# Patient Record
Sex: Female | Born: 1955 | Race: White | Hispanic: No | Marital: Married | Smoking: Former smoker
Health system: Southern US, Community
[De-identification: ages and names within clinical notes are randomized; demographics above are authoritative.]

## PROBLEM LIST (undated history)

## (undated) DIAGNOSIS — M797 Fibromyalgia: Secondary | ICD-10-CM

## (undated) DIAGNOSIS — M533 Sacrococcygeal disorders, not elsewhere classified: Secondary | ICD-10-CM

## (undated) DIAGNOSIS — M25551 Pain in right hip: Secondary | ICD-10-CM

## (undated) DIAGNOSIS — F419 Anxiety disorder, unspecified: Secondary | ICD-10-CM

## (undated) DIAGNOSIS — G8929 Other chronic pain: Secondary | ICD-10-CM

## (undated) DIAGNOSIS — M545 Low back pain: Secondary | ICD-10-CM

## (undated) DIAGNOSIS — G4730 Sleep apnea, unspecified: Secondary | ICD-10-CM

## (undated) DIAGNOSIS — N3281 Overactive bladder: Secondary | ICD-10-CM

## (undated) DIAGNOSIS — G4733 Obstructive sleep apnea (adult) (pediatric): Secondary | ICD-10-CM

## (undated) DIAGNOSIS — M79606 Pain in leg, unspecified: Secondary | ICD-10-CM

## (undated) DIAGNOSIS — E785 Hyperlipidemia, unspecified: Secondary | ICD-10-CM

## (undated) DIAGNOSIS — G43909 Migraine, unspecified, not intractable, without status migrainosus: Secondary | ICD-10-CM

## (undated) HISTORY — DX: Fibromyalgia: M79.7

## (undated) HISTORY — DX: Sleep apnea, unspecified: G47.30

## (undated) HISTORY — PX: BREAST EXCISIONAL BIOPSY: SUR124

## (undated) HISTORY — DX: Overactive bladder: N32.81

## (undated) HISTORY — DX: Anxiety disorder, unspecified: F41.9

## (undated) HISTORY — PX: CARPAL TUNNEL RELEASE: SHX101

## (undated) HISTORY — DX: Low back pain: M54.5

## (undated) HISTORY — DX: Other chronic pain: G89.29

## (undated) HISTORY — DX: Hyperlipidemia, unspecified: E78.5

## (undated) HISTORY — PX: TUBAL LIGATION: SHX77

## (undated) HISTORY — DX: Sacrococcygeal disorders, not elsewhere classified: M53.3

## (undated) HISTORY — DX: Pain in right hip: M25.551

## (undated) HISTORY — DX: Pain in leg, unspecified: M79.606

## (undated) HISTORY — DX: Migraine, unspecified, not intractable, without status migrainosus: G43.909

## (undated) HISTORY — DX: Obstructive sleep apnea (adult) (pediatric): G47.33

---

## 2015-03-28 DIAGNOSIS — N951 Menopausal and female climacteric states: Secondary | ICD-10-CM | POA: Insufficient documentation

## 2015-03-28 DIAGNOSIS — M797 Fibromyalgia: Secondary | ICD-10-CM | POA: Insufficient documentation

## 2015-03-28 DIAGNOSIS — G4733 Obstructive sleep apnea (adult) (pediatric): Secondary | ICD-10-CM | POA: Insufficient documentation

## 2015-03-28 DIAGNOSIS — E7800 Pure hypercholesterolemia, unspecified: Secondary | ICD-10-CM | POA: Insufficient documentation

## 2015-03-28 DIAGNOSIS — N3281 Overactive bladder: Secondary | ICD-10-CM | POA: Insufficient documentation

## 2015-03-28 DIAGNOSIS — F411 Generalized anxiety disorder: Secondary | ICD-10-CM | POA: Insufficient documentation

## 2015-09-05 DIAGNOSIS — R42 Dizziness and giddiness: Secondary | ICD-10-CM | POA: Insufficient documentation

## 2015-09-05 DIAGNOSIS — G43109 Migraine with aura, not intractable, without status migrainosus: Secondary | ICD-10-CM | POA: Insufficient documentation

## 2016-01-07 ENCOUNTER — Ambulatory Visit: Payer: BLUE CROSS/BLUE SHIELD | Attending: Pain Medicine | Admitting: Pain Medicine

## 2016-01-07 ENCOUNTER — Encounter: Payer: Self-pay | Admitting: Pain Medicine

## 2016-01-07 VITALS — BP 141/69 | HR 84 | Temp 98.2°F | Resp 16 | Ht 62.0 in | Wt 168.0 lb

## 2016-01-07 DIAGNOSIS — R42 Dizziness and giddiness: Secondary | ICD-10-CM | POA: Insufficient documentation

## 2016-01-07 DIAGNOSIS — M25551 Pain in right hip: Secondary | ICD-10-CM | POA: Insufficient documentation

## 2016-01-07 DIAGNOSIS — G8929 Other chronic pain: Secondary | ICD-10-CM

## 2016-01-07 DIAGNOSIS — M79605 Pain in left leg: Secondary | ICD-10-CM | POA: Diagnosis not present

## 2016-01-07 DIAGNOSIS — M79604 Pain in right leg: Secondary | ICD-10-CM | POA: Diagnosis not present

## 2016-01-07 DIAGNOSIS — F17290 Nicotine dependence, other tobacco product, uncomplicated: Secondary | ICD-10-CM

## 2016-01-07 DIAGNOSIS — M545 Low back pain, unspecified: Secondary | ICD-10-CM | POA: Insufficient documentation

## 2016-01-07 DIAGNOSIS — Z5181 Encounter for therapeutic drug level monitoring: Secondary | ICD-10-CM

## 2016-01-07 DIAGNOSIS — Z79899 Other long term (current) drug therapy: Secondary | ICD-10-CM

## 2016-01-07 DIAGNOSIS — F329 Major depressive disorder, single episode, unspecified: Secondary | ICD-10-CM | POA: Insufficient documentation

## 2016-01-07 DIAGNOSIS — Z0189 Encounter for other specified special examinations: Secondary | ICD-10-CM

## 2016-01-07 DIAGNOSIS — F17200 Nicotine dependence, unspecified, uncomplicated: Secondary | ICD-10-CM | POA: Diagnosis not present

## 2016-01-07 DIAGNOSIS — M533 Sacrococcygeal disorders, not elsewhere classified: Secondary | ICD-10-CM | POA: Insufficient documentation

## 2016-01-07 DIAGNOSIS — G4733 Obstructive sleep apnea (adult) (pediatric): Secondary | ICD-10-CM | POA: Diagnosis not present

## 2016-01-07 DIAGNOSIS — F411 Generalized anxiety disorder: Secondary | ICD-10-CM | POA: Diagnosis not present

## 2016-01-07 DIAGNOSIS — M47816 Spondylosis without myelopathy or radiculopathy, lumbar region: Secondary | ICD-10-CM

## 2016-01-07 DIAGNOSIS — Z79891 Long term (current) use of opiate analgesic: Secondary | ICD-10-CM

## 2016-01-07 DIAGNOSIS — E7800 Pure hypercholesterolemia, unspecified: Secondary | ICD-10-CM | POA: Insufficient documentation

## 2016-01-07 DIAGNOSIS — G43109 Migraine with aura, not intractable, without status migrainosus: Secondary | ICD-10-CM | POA: Insufficient documentation

## 2016-01-07 DIAGNOSIS — M79606 Pain in leg, unspecified: Secondary | ICD-10-CM

## 2016-01-07 DIAGNOSIS — F17201 Nicotine dependence, unspecified, in remission: Secondary | ICD-10-CM | POA: Insufficient documentation

## 2016-01-07 DIAGNOSIS — F1190 Opioid use, unspecified, uncomplicated: Secondary | ICD-10-CM

## 2016-01-07 HISTORY — DX: Other chronic pain: G89.29

## 2016-01-07 HISTORY — DX: Pain in right hip: M25.551

## 2016-01-07 HISTORY — DX: Sacrococcygeal disorders, not elsewhere classified: M53.3

## 2016-01-07 HISTORY — DX: Other chronic pain: M54.50

## 2016-01-07 HISTORY — DX: Pain in leg, unspecified: M79.606

## 2016-01-07 NOTE — Progress Notes (Signed)
Safety precautions to be maintained throughout the outpatient stay will include: orient to surroundings, keep bed in low position, maintain call bell within reach at all times, provide assistance with transfer out of bed and ambulation.  New patient  Medicine: fentanyl 50 mcg count 2 patches date filled 12/09/2015

## 2016-01-07 NOTE — Assessment & Plan Note (Signed)
This is a problem doing pain management as nicotine speeds up to metabolism of opioids.

## 2016-01-07 NOTE — Assessment & Plan Note (Signed)
The patient is currently using Duragesic 50 make micrograms per hour every 48 hours. This would suggest that the patient has already developed significant tolerance this medication. In addition to this medication she is using tramadol 50 mg 2 tablets on the day that she changes the patch.

## 2016-01-07 NOTE — Assessment & Plan Note (Signed)
Today's 01/07/2016 I went over the treatment plan including the diagnostic lumbar spine x-rays, sacroiliac joint x-rays, and hip joint x-rays. We will be doing some baseline lab work and also be sending the patient to a medical psychology consult for substance use disorder evaluation. Today I have explained to the patient that I believe that most of this pain is coming from the lumbar facets as well as the sacroiliac joint on the right side and there is also a component of the pain coming from the right hip joint. I have given them some options in terms of interventional therapies in order to lower the pain and then be able to address the issue of tolerance that she seems to already have to the Duragesic 50 mcg/h patch. I have made it very clear that I will not be doing opioid rotations and I will also did not increase the Duragesic patch. Complete review of the medical record failed to produce any clear evidence of documented pathology that would justify the use of opioids. I have explained to them that unless we can find this, I will not be prescribing any opioids. She has expressed her reluctance to interventional therapies, based on the fact that apparently she had 1 lumbar epidural steroid injection done [facility] [place] [facility] [place] [facility] [place] where she was told that she would get 3 injections by a Dr. Maureen Ralphsesar A. [facility] [phone]([address] [place], [place] [id number]).

## 2016-01-07 NOTE — Assessment & Plan Note (Addendum)
Patient indicates that her pain is in the buttocks area, however physical examination would suggest that the pain still in an area associated to the lower back. The patient had exact reproduction of her usual pain with hyperextension and rotation suggesting that this pain is secondary to lumbar facet disease. However, there seems to be a component to this pain coming from the right sacroiliac joint, as shown by a positive Patrick maneuver on the right side.

## 2016-01-07 NOTE — Progress Notes (Signed)
The Patient's Name: Jade Smith MRN: [id number] DOB: 09-02-1956 DOS: 01/07/2016  Primary Reason(s) for Visit: Initial Patient Evaluation CC: No chief complaint on file.   HPI  Jade Smith is a 60 y.o. year old, female patient, who comes today for an initial evaluation. She has Dizziness; Anxiety, generalized; Climacteric; Acute onset aura migraine; Detrusor muscle hypertonia; Obstructive apnea; Pure hypercholesterolemia; Chronic low back pain (Location of Primary Source of Pain) (Bilateral) (R>L); Chronic lower extremity pain (Location of Secondary source of pain) (Bilateral) (R>L); Chronic pain; Long term current use of opiate analgesic; Long term prescription opiate use; Opiate use (120 MME/Day); Encounter for therapeutic drug level monitoring; Encounter for pain management planning; Chronic sacroiliac joint pain (Right); Chronic hip pain (Right); Lumbar spondylosis; Lumbar facet syndrome (Location of Primary Source of Pain) (Bilateral) (R>L); and Nicotine dependence on her problem list.. Her primarily concern today is the No chief complaint on file.   The patient comes in today clinics today for the first time for chronic pain management evaluation. The patient indicates that her worst pain is in the area of the buttocks or lower back with the right side being worst on the left. The second area of worst pain is that of the lower extremities again with the right leg being worst on the left. In the case of the right lower extremity the pain goes through the back of the leg down to the level of the knee and occasionally when he gets really bad down to the level of the ankle but never into the foot. In the case of the left lower extremity the pattern is identical but not his back. The third area of worst pain is that of the lower back or the area of the midline. The patient indicates that she has been told that she has a piriformis syndrome.   Reported Pain Score: 0-No pain Reported level is  inconsistent with clinical obrservations. Pain Type: Chronic pain Pain Location: Buttocks Pain Orientation: Left, Right Pain Descriptors / Indicators: Aching, Constant, Radiating, Sharp Pain Frequency: Constant  Onset and Duration: Gradual, Date of onset: Since 2005 and Present longer than 3 months Cause of pain: The patient indicates that this was a work related injury. 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 The patient indicates that she reported this to [employer] and 07/11/2004. However, the patient cannot recall one single specific event that might have triggered this pain. Severity: Getting worse, NAS-11 at its worse: 10/10, NAS-11 at its best: 0/10, NAS-11 now: 0/10 and NAS-11 on the average: 4/10 Timing: Morning, Noon, Afternoon, Evening, Night, Not influenced by the time of the day and After activity or exercise Aggravating Factors: Bending, Climbing, Lifiting, Prolonged sitting, Prolonged standing, Squatting, Stooping , Twisting, Walking uphill and Working Alleviating Factors: Cold packs, Lying down, Medications and Resting Associated Problems: Depression, Pain that wakes patient up and Pain that does not allow patient to sleep Quality of Pain: Aching, Agonizing, Annoying, Cruel, Deep, Dreadful, Exhausting, Horrible and Work related Previous Examinations or Tests: MRI scan. The patient indicates having had an MRI around 2005 2 2006 in [place], [place]. She recalls that she had it at [facility] in [place]. Previous Treatments: Chiropractic manipulations, Physical Therapy, Stretching exercises and TENS she indicates having tried physical therapy but according to her it did not help. She indicates having had this at the [facility]  Historic Controlled Substance Pharmacotherapy Review  Previously Prescribed Opioids:  Analgesic: Duragesic 50 mcg/h every 48  hours plus tramadol 50 mg when necessary. She indicates taking 2 tablets today that she needs to change her patch. MME/day: 120 mg/day Pharmacokinetics: Onset of action (Liberation/Absorption): Within expected pharmacological parameters Time to Peak effect (Distribution): Timing and results are as within normal expected parameters Duration of action (Metabolism/Excretion): Within normal limits for medication Pharmacodynamics: Analgesic Effect: 100% Activity Facilitation: Medication(s) allow patient to sit, stand, walk, and do the basic ADLs Perceived Effectiveness: Described as relatively effective, allowing for increase in activities of daily living (ADL) Side-effects or Adverse reactions: None reported Historical Background Evaluation: [place] PDMP: Five (5) year initial data search conducted. Historical Hospital-associated UDS Results:  No results found for: THCU, COCAINSCRNUR, PCPSCRNUR, MDMA, AMPHETMU, METHADONE, ETOH UDS Results: No UDS available, at this time UDS Interpretation: No UDS available, at this time Medication Assessment Form: Not applicable. Initial evaluation. The patient has not received any medications from our practice Treatment compliance: Not applicable. Initial evaluation Risk Assessment: Aberrant Behavior: inability to consider abstinence, claims that "nothing else works" and resistance to changing therapy Opioid Fatal Overdose Risk Factors: Sleep Apnea Substance Use Disorder (SUD) Risk Level: Pending results of Medical Psychology Evaluation for SUD Opioid Risk Tool (ORT) Score: Total Score: 3 Low Risk for SUD (Score <3) Depression Scale Score: PHQ-2: PHQ-2 Total Score: 4 45.5% Probability of major depressive disorder (4) PHQ-9: PHQ-9 Total Score: 9 Mild depression (5-9)  Pharmacologic Plan: Pending ordered tests and/or consults  Neuromodulation Therapy Review  Type: No neuromodulatory devices implanted Side-effects or Adverse reactions: No device  reported Effectiveness: No device reported  Allergies  Jade Smith has no allergies on file.  Meds  The patient has a current medication list which includes the following prescription(s): atorvastatin, cyclobenzaprine, estradiol, fentanyl, medroxyprogesterone, nortriptyline, oxybutynin, sumatriptan, and tramadol. Requested Prescriptions    No prescriptions requested or ordered in this encounter    ROS  Cardiovascular History: Negative for hypertension, coronary artery diseas, myocardial infraction, anticoagulant therapy or heart failure Pulmonary or Respiratory History: Smoker and Snoring  Neurological History: Negative for epilepsy, stroke, urinary or fecal inontinence, spina bifida or tethered cord syndrome Psychological-Psychiatric History: Anxiety Gastrointestinal History: Negative for peptic ulcer disease, hiatal hernia, GERD, IBS, hepatitis, cirrhosis or pancreatitis Genitourinary History: Negative for nephrolithiasis, hematuria, renal failure or chronic kidney disease Hematological History: Negative for anticoagulant therapy, anemia, bruising or bleeding easily, hemophilia, sickle cell disease or trait, thrombocytopenia or coagulupathies Endocrine History: Negative for diabetes or thyroid disease Rheumatologic History: Negative for lupus, osteoarthritis, rheumatoid arthritis, myositis, polymyositis or fibromyagia Musculoskeletal History: Negative for myasthenia gravis, muscular dystrophy, multiple sclerosis or malignant hyperthermia Work History: Out of work due to pain  PFSH  Medical:  Jade Smith  has a past medical history of Anxiety; Hyperlipidemia; and Sleep apnea. Family: family history includes Cancer in her mother; Diabetes in her father. Surgical:  has past surgical history that includes Cesarean section and Carpal tunnel release (Bilateral). Tobacco:  has no tobacco history on file. Alcohol:  has no alcohol history on file. Drug:  has no drug history on  file.  Physical Exam  Vitals:  Today's Vitals   01/07/16 1440 01/07/16 1444  BP: 141/69   Pulse: 84   Temp: 98.2 F (36.8 C)   TempSrc: Oral   Resp: 16   Height: 5\' 2"  (1.575 m)   Weight: 168 lb (76.204 kg)   SpO2: 98%   PainSc: 0-No pain 0-No pain  PainLoc: Buttocks     Calculated BMI: Body mass  index is 30.72 kg/(m^2). Obese (Class I) (30-34.9 kg/m2) - 68% higher incidence of chronic pain  General appearance: alert, cooperative, appears stated age, no distress and moderately obese Eyes: PERLA Respiratory: No evidence respiratory distress, no audible rales or ronchi and no use of accessory muscles of respiration  Lumbar Spine Exam  Inspection: No gross anomalies detected Alignment: Symetrical Palpation: Negative ROM:  Flexion: WNL Extension: Decreased ROM Lateral Bending: WNL Rotation: WNL Provocative Tests:  Lumbar Hyperextension and rotation test:  Positive for facet pain bilaterally with the right side being worse than the left. Patrick's Maneuver: Positive for both, sacroiliac and hip joint pain on the right side  Gait Evaluation  Gait: WNL  Lower Extremity Exam  Inspection: No gross anomalies detected ROM: Adequate Sensory:  Normal Motor: Unremarkable  Toe walk (S1): WNL  Heal walk (L5): WNL  Provocative Tests:  Straight Leg Raise (SLR) test: Negative Angle: (-) throughout Piriformis Test:  Tenderness over the buttocks area.   Assessment  Primary Diagnosis & Pertinent Problem List: The primary encounter diagnosis was Chronic pain. Diagnoses of Chronic low back pain, Chronic pain of lower extremity, unspecified laterality, Long term current use of opiate analgesic, Long term prescription opiate use, Opiate use, Encounter for therapeutic drug level monitoring, Encounter for pain management planning, Chronic right sacroiliac joint pain, Chronic right hip pain, Lumbar spondylosis, unspecified spinal osteoarthritis, Facet syndrome, lumbar, Anxiety, generalized,  Obstructive apnea, and Dependence on nicotine from other tobacco product were also pertinent to this visit.  Visit Diagnosis: 1. Chronic pain   2. Chronic low back pain   3. Chronic pain of lower extremity, unspecified laterality   4. Long term current use of opiate analgesic   5. Long term prescription opiate use   6. Opiate use   7. Encounter for therapeutic drug level monitoring   8. Encounter for pain management planning   9. Chronic right sacroiliac joint pain   10. Chronic right hip pain   11. Lumbar spondylosis, unspecified spinal osteoarthritis   12. Facet syndrome, lumbar   13. Anxiety, generalized   14. Obstructive apnea   15. Dependence on nicotine from other tobacco product     Assessment: Opiate use (120 MME/Day) The patient is currently using Duragesic 50 make micrograms per hour every 48 hours. This would suggest that the patient has already developed significant tolerance this medication. In addition to this medication she is using tramadol 50 mg 2 tablets on the day that she changes the patch.  Chronic low back pain (Location of Primary Source of Pain) (Bilateral) (R>L) Patient indicates that her pain is in the buttocks area, however physical examination would suggest that the pain still in an area associated to the lower back. The patient had exact reproduction of her usual pain with hyperextension and rotation suggesting that this pain is secondary to lumbar facet disease. However, there seems to be a component to this pain coming from the right sacroiliac joint, as shown by a positive Patrick maneuver on the right side.  Encounter for pain management planning Today's 01/07/2016 I went over the treatment plan including the diagnostic lumbar spine x-rays, sacroiliac joint x-rays, and hip joint x-rays. We will be doing some baseline lab work and also be sending the patient to a medical psychology consult for substance use disorder evaluation. Today I have explained to the  patient that I believe that most of this pain is coming from the lumbar facets as well as the sacroiliac joint on the right side and  there is also a component of the pain coming from the right hip joint. I have given them some options in terms of interventional therapies in order to lower the pain and then be able to address the issue of tolerance that she seems to already have to the Duragesic 50 mcg/h patch. I have made it very clear that I will not be doing opioid rotations and I will also did not increase the Duragesic patch. Complete review of the medical record failed to produce any clear evidence of documented pathology that would justify the use of opioids. I have explained to them that unless we can find this, I will not be prescribing any opioids. She has expressed her reluctance to interventional therapies, based on the fact that apparently she had 1 lumbar epidural steroid injection done [place] [facility] [place] [facility] [place] where she was told that she would get 3 injections by a Dr. Maureen Ralphsesar A. [facility] [phone]([address] [place], [place] [phone]).   Nicotine dependence This is a problem doing pain management as nicotine speeds up to metabolism of opioids.    Plan of Care  Note: As per protocol, today's visit has been an evaluation only. We have not taken over the patient's controlled substance management.  Pharmacotherapy (Medications Ordered): No orders of the defined types were placed in this encounter.    Lab-work & Procedure Ordered: Orders Placed This Encounter  Procedures  . DG Lumbar Spine Complete W/Bend    Standing Status: Future     Number of Occurrences:      Standing Expiration Date: 01/06/2017    Scheduling Instructions:     Please include flexion and extension views and report any spinal instability (>4 mm displacement of any spondylolisthesis). If present, please report any spondylolisthesis grade, as well as displacement in  millimeters.    Order Specific Question:  Reason for Exam (SYMPTOM  OR DIAGNOSIS REQUIRED)    Answer:  Low back pain    Order Specific Question:  Is the patient pregnant?    Answer:  No    Order Specific Question:  Preferred imaging location?    Answer:  [facility]    Order Specific Question:  Call Results- Best Contact Number?    Answer:  [phone] (Pain Clinic facility) (Dr. Laban EmperorNaveira)  . DG Si Joints    Standing Status: Future     Number of Occurrences:      Standing Expiration Date: 01/06/2017    Order Specific Question:  Reason for Exam (SYMPTOM  OR DIAGNOSIS REQUIRED)    Answer:  Sacroiliac joint pain    Order Specific Question:  Is the patient pregnant?    Answer:  No    Order Specific Question:  Preferred imaging location?    Answer:  [facility]    Order Specific Question:  Call Results- Best Contact Number?    Answer:  [phone] (Pain Clinic facility) (Dr. Laban EmperorNaveira)  . DG HIP UNILAT W OR W/O PELVIS 2-3 VIEWS RIGHT    Standing Status: Future     Number of Occurrences:      Standing Expiration Date: 01/06/2017    Order Specific Question:  Reason for Exam (SYMPTOM  OR DIAGNOSIS REQUIRED)    Answer:  Sacroiliac joint pain    Order Specific Question:  Is the patient pregnant?    Answer:  No    Order Specific Question:  Preferred imaging location?    Answer:  [facility]    Order Specific Question:  Call Results- Best Contact Number?    Answer:  [phone] (Pain Clinic facility) (Dr. Laban Emperor)  . Compliance Drug Analysis, Ur    Volume: 30 ml(s). Minimum 3 ml of urine is needed. Document temperature of fresh sample. Indications: Long term (current) use of opiate analgesic (Z79.891) Test#: [id number] (Comprehensive Profile)  . Comprehensive metabolic panel    Standing Status: Future     Number of Occurrences:      Standing Expiration Date: 02/06/2016    Order Specific Question:  Has the patient fasted?    Answer:  No  . C-reactive protein     Standing Status: Future     Number of Occurrences:      Standing Expiration Date: 02/06/2016  . Magnesium    Standing Status: Future     Number of Occurrences:      Standing Expiration Date: 02/06/2016  . Sedimentation rate    Standing Status: Future     Number of Occurrences:      Standing Expiration Date: 02/06/2016  . Vitamin B12    Standing Status: Future     Number of Occurrences:      Standing Expiration Date: 02/06/2016  . Vitamin D 1,25 dihydroxy    Standing Status: Future     Number of Occurrences:      Standing Expiration Date: 02/06/2016  . Ambulatory referral to Psychology    Referral Priority:  Routine    Referral Type:  Psychiatric    Referral Reason:  Specialty Services Required    Referred to Provider:  Lance Coon, PHD    Requested Specialty:  Psychology    Number of Visits Requested:  1    Imaging Ordered: AMB REFERRAL TO PSYCHOLOGY DG LUMBAR SPINE COMPLETE W/BEND 6+V DG SI JOINTS DG HIP UNILAT W OR W/O PELVIS 2-3 VIEWS RIGHT  Interventional Therapies: Scheduled: None at this time. PRN Procedures:  1. Diagnostic bilateral lumbar facet block under fluoroscopic guidance and IV sedation.  2. Diagnostic right sacroiliac joint block under fluoroscopic guidance, no sedation needed.  3. Diagnostic right intra-articular hip joint injection under fluoroscopic guidance, no sedation required.    Referral(s) or Consult(s): Medical psychology consult for substance use disorder evaluation.  Medications administered during this visit: Jade Smith had no medications administered during this visit.  Prescriptions ordered during this visit: New Prescriptions   No medications on file    No future appointments.  Primary Care Physician: [facility], MD Location: [facility] Note by: Sydnee Levans. Laban Emperor, M.D, DABA, DABAPM, DABPM, DABIPP, FIPP

## 2016-01-08 ENCOUNTER — Other Ambulatory Visit: Payer: Self-pay | Admitting: Pain Medicine

## 2016-01-13 ENCOUNTER — Other Ambulatory Visit: Payer: Self-pay | Admitting: Obstetrics and Gynecology

## 2016-01-13 DIAGNOSIS — Z1239 Encounter for other screening for malignant neoplasm of breast: Secondary | ICD-10-CM

## 2016-01-13 DIAGNOSIS — N644 Mastodynia: Secondary | ICD-10-CM

## 2016-01-13 LAB — COMPLIANCE DRUG ANALYSIS, UR: PDF: 0

## 2016-01-20 ENCOUNTER — Other Ambulatory Visit: Payer: Self-pay | Admitting: *Deleted

## 2016-01-20 ENCOUNTER — Inpatient Hospital Stay
Admission: RE | Admit: 2016-01-20 | Discharge: 2016-01-20 | Disposition: A | Discharge status: Home / Self Care | Payer: Self-pay | Source: Ambulatory Visit | Attending: *Deleted | Admitting: *Deleted

## 2016-01-20 DIAGNOSIS — Z9289 Personal history of other medical treatment: Secondary | ICD-10-CM

## 2016-01-25 NOTE — Progress Notes (Signed)
Quick Note:  NOTE: This forensic urine drug screen (UDS) test was conducted using a state-of-the-art ultra high performance liquid chromatography and mass spectrometry system (UPLC/MS-MS), the most sophisticated and accurate method available. UPLC/MS-MS is 1,000 times more precise and accurate than standard gas chromatography and mass spectrometry (GC/MS). This system can analyze 26 drug categories and 180 drug compounds.  Results reviewed. Anomalies determined to be benign. Unexpected results determined to be associated to miscommunication during the recording of medications taken within the last 72 hours. ______ 

## 2016-02-03 ENCOUNTER — Ambulatory Visit
Admission: RE | Admit: 2016-02-03 | Discharge: 2016-02-03 | Disposition: A | Discharge status: Home / Self Care | Payer: BLUE CROSS/BLUE SHIELD | Source: Ambulatory Visit | Attending: Obstetrics and Gynecology | Admitting: Obstetrics and Gynecology

## 2016-02-03 DIAGNOSIS — N644 Mastodynia: Secondary | ICD-10-CM

## 2016-02-03 DIAGNOSIS — Z1239 Encounter for other screening for malignant neoplasm of breast: Secondary | ICD-10-CM

## 2016-02-03 DIAGNOSIS — N63 Unspecified lump in breast: Secondary | ICD-10-CM | POA: Diagnosis present

## 2016-02-04 ENCOUNTER — Other Ambulatory Visit: Payer: Self-pay | Admitting: Obstetrics and Gynecology

## 2016-02-04 DIAGNOSIS — N6320 Unspecified lump in the left breast, unspecified quadrant: Secondary | ICD-10-CM

## 2016-02-10 ENCOUNTER — Ambulatory Visit
Admission: RE | Admit: 2016-02-10 | Discharge: 2016-02-10 | Disposition: A | Discharge status: Home / Self Care | Payer: BLUE CROSS/BLUE SHIELD | Source: Ambulatory Visit | Attending: Obstetrics and Gynecology | Admitting: Obstetrics and Gynecology

## 2016-02-10 DIAGNOSIS — N6320 Unspecified lump in the left breast, unspecified quadrant: Secondary | ICD-10-CM

## 2016-02-10 DIAGNOSIS — N63 Unspecified lump in breast: Secondary | ICD-10-CM | POA: Diagnosis present

## 2016-02-10 DIAGNOSIS — N6022 Fibroadenosis of left breast: Secondary | ICD-10-CM | POA: Diagnosis not present

## 2016-02-10 HISTORY — PX: BREAST BIOPSY: SHX20

## 2016-02-11 LAB — SURGICAL PATHOLOGY

## 2016-09-01 ENCOUNTER — Encounter (INDEPENDENT_AMBULATORY_CARE_PROVIDER_SITE_OTHER): Payer: Self-pay

## 2016-09-15 ENCOUNTER — Ambulatory Visit: Payer: Self-pay | Admitting: Family Medicine

## 2016-09-15 ENCOUNTER — Ambulatory Visit (INDEPENDENT_AMBULATORY_CARE_PROVIDER_SITE_OTHER): Payer: BLUE CROSS/BLUE SHIELD | Admitting: Family Medicine

## 2016-09-15 ENCOUNTER — Encounter: Payer: Self-pay | Admitting: Family Medicine

## 2016-09-15 VITALS — BP 130/77 | HR 67 | Temp 98.8°F | Ht 62.0 in | Wt 168.0 lb

## 2016-09-15 DIAGNOSIS — R42 Dizziness and giddiness: Secondary | ICD-10-CM

## 2016-09-15 DIAGNOSIS — Z7689 Persons encountering health services in other specified circumstances: Secondary | ICD-10-CM | POA: Diagnosis not present

## 2016-09-15 MED ORDER — ALBUTEROL SULFATE HFA 108 (90 BASE) MCG/ACT IN AERS: 2.0000 | INHALATION_SPRAY | Freq: Four times a day (QID) | RESPIRATORY_TRACT | 0 refills | Status: DC | PRN

## 2016-09-15 NOTE — Progress Notes (Signed)
   BP 130/77   Pulse 67   Temp 98.8 F (37.1 C)   Ht 5\' 2"  (1.575 m)   Wt 168 lb (76.2 kg)   SpO2 97%   BMI 30.73 kg/m    Subjective:    Patient ID: Jade Smith, female    DOB: 02/06/1956, 60 y.o.   MRN: [id number]  HPI: Jade Smith is a 60 y.o. female  Chief Complaint  Patient presents with  . Establish Care    no concerns   Patient presents for [facility] visit today. Denies any concerns other than occasional dizziness that appears to be related to her Bipap machine for sleep. Was previously on cpap machine but switched due to same symptoms. Things improved for a while, but now feeling the symptoms return. Workups have been negative for inner ear issues or cardiac issues previously. She believes it to be related to not getting enough oxygen. Does feel occasional SOB during daytime as well as when trying to sleep.   Managed by Pain Medicine for chronic narcotic use for multiple back issues. Things are going well with that now.   Relevant past medical, surgical, family and social history reviewed and updated as indicated. Interim medical history since our last visit reviewed. Allergies and medications reviewed and updated.  Review of Systems  Constitutional: Negative.   HENT: Negative.   Eyes: Negative.   Respiratory: Positive for shortness of breath.   Cardiovascular: Negative.   Gastrointestinal: Negative.   Genitourinary: Negative.   Musculoskeletal: Negative.   Neurological: Positive for dizziness.  Psychiatric/Behavioral: Negative.     Per HPI unless specifically indicated above     Objective:    BP 130/77   Pulse 67   Temp 98.8 F (37.1 C)   Ht 5\' 2"  (1.575 m)   Wt 168 lb (76.2 kg)   SpO2 97%   BMI 30.73 kg/m   Wt Readings from Last 3 Encounters:  09/15/16 168 lb (76.2 kg)  01/07/16 168 lb (76.2 kg)    Physical Exam  Constitutional: She is oriented to person, place, and time. She appears well-developed and well-nourished.  HENT:  Head:  Atraumatic.  Right Ear: External ear normal.  Left Ear: External ear normal.  Nose: Nose normal.  Mouth/Throat: Oropharynx is clear and moist. No oropharyngeal exudate.  B/l TMs normal  Eyes: Conjunctivae are normal. No scleral icterus.  Neck: Normal range of motion. Neck supple.  Cardiovascular: Normal rate and normal heart sounds.   Pulmonary/Chest: Effort normal and breath sounds normal. No respiratory distress.  Musculoskeletal: Normal range of motion.  Lymphadenopathy:    She has no cervical adenopathy.  Neurological: She is alert and oriented to person, place, and time.  Skin: Skin is warm and dry.  Psychiatric: She has a normal mood and affect. Her behavior is normal.  Nursing note and vitals reviewed.     Assessment & Plan:   Problem List Items Addressed This Visit      Other   Dizziness    Other Visit Diagnoses    Encounter to establish care    -  Primary    Discussed continued good hydration. Albuterol prn to see if that helps her sensation of not having enough O2. Will get a spirometry if no relief.    Follow up plan: Return for physical exam.

## 2016-09-15 NOTE — Patient Instructions (Signed)
Follow-up for physical exam

## 2016-11-02 ENCOUNTER — Telehealth: Payer: Self-pay | Admitting: Family Medicine

## 2016-11-02 NOTE — Telephone Encounter (Signed)
Patient notified. She said she had episodes like this before she started the inhaler. Says it usually happens if she bends over or gets up too quickly. I advised patient that she could go see ENT and they might be able to help more with her vertigo symptoms. She seemed willing to try that option.

## 2016-11-02 NOTE — Telephone Encounter (Signed)
Please call pt and let her know that the albuterol can make her feel a bit nervous or jittery, which could be causing the dizziness she is experiencing. If dizziness is severe, she can d/c the inhaler. It is only for as needed use anyway.

## 2016-11-02 NOTE — Telephone Encounter (Signed)
Patient states she would like to let Roosvelt Maserachel Lane, PA-C know that the Albuterol inhaler she was prescribed during her last visit is causing dizziness when she bends over.  Patient wonders if this is normal or if she needs a different RX. Please call patient to advise.  [facility] - [address]

## 2016-11-02 NOTE — Telephone Encounter (Signed)
Routing to provider. Appt on 12/16/16.

## 2016-11-03 ENCOUNTER — Other Ambulatory Visit: Payer: Self-pay | Admitting: Family Medicine

## 2016-11-03 MED ORDER — ESTRADIOL 1 MG PO TABS: 1.0000 mg | ORAL_TABLET | Freq: Every day | ORAL | 0 refills | Status: DC

## 2016-11-03 MED ORDER — OXYBUTYNIN CHLORIDE ER 10 MG PO TB24: 10.0000 mg | ORAL_TABLET | Freq: Every day | ORAL | 0 refills | Status: DC

## 2016-11-03 NOTE — Telephone Encounter (Signed)
Routing to provider. Appt on 12/16/16. We also got a fax refill request on Estrace 1mg .

## 2016-11-24 ENCOUNTER — Telehealth: Payer: Self-pay | Admitting: Family Medicine

## 2016-11-24 NOTE — Telephone Encounter (Signed)
Routing to provider  

## 2016-11-24 NOTE — Telephone Encounter (Signed)
Needs appt

## 2016-11-24 NOTE — Telephone Encounter (Signed)
Patient notified, appointment scheduled

## 2016-11-26 ENCOUNTER — Encounter: Payer: Self-pay | Admitting: Family Medicine

## 2016-11-26 ENCOUNTER — Ambulatory Visit (INDEPENDENT_AMBULATORY_CARE_PROVIDER_SITE_OTHER): Payer: BLUE CROSS/BLUE SHIELD | Admitting: Family Medicine

## 2016-11-26 VITALS — BP 158/76 | HR 66 | Temp 99.0°F | Wt 172.0 lb

## 2016-11-26 DIAGNOSIS — F411 Generalized anxiety disorder: Secondary | ICD-10-CM

## 2016-11-26 MED ORDER — BUSPIRONE HCL 10 MG PO TABS: 10.0000 mg | ORAL_TABLET | Freq: Two times a day (BID) | ORAL | 0 refills | Status: DC

## 2016-11-26 NOTE — Assessment & Plan Note (Signed)
Long discussion with patient about her options. Used shared decision making about next steps - will start Buspar 10 mg BID x 1 week, then increase to TID. Discussed risks and benefits of medication. Also discussed that it not advisable to take benzo's while on chronic narcotic pain medications. Patient understanding and agreeable to plan. Will follow up about how medication is going at her physical in a few weeks

## 2016-11-26 NOTE — Progress Notes (Signed)
BP (!) 158/76   Pulse 66   Temp 99 F (37.2 C)   Wt 172 lb (78 kg)   SpO2 96%   BMI 31.46 kg/m    Subjective:    Patient ID: Jade Smith, female    DOB: 02/18/1956, 61 y.o.   MRN: [id number]  HPI: Jade Smith is a 61 y.o. female  Chief Complaint  Patient presents with  . Anxiety    has had a history of anxiety, been on Lorazepam before. Not sure why she stopped it.    Patient presents today to discuss her increasingly bothersome anxiety symptoms. Has long history of anxiety, previously treated with prn Ativan, but did not feel she needed medication until recently. Has been involved in some very stressful circumstances the past few weeks and states her "nerves are shot". Feeling very shaky and nervous throughout the day and it is impacting relationships and day to day activities. Denies panic episodes, anhedonia, SI/HI.   GAD 7 : Generalized Anxiety Score 11/26/2016  Nervous, Anxious, on Edge 3  Control/stop worrying 3  Worry too much - different things 3  Trouble relaxing 2  Restless 2  Easily annoyed or irritable 1  Afraid - awful might happen 3  Total GAD 7 Score 17  Anxiety Difficulty Very difficult   Relevant past medical, surgical, family and social history reviewed and updated as indicated. Interim medical history since our last visit reviewed. Allergies and medications reviewed and updated.  Review of Systems  Constitutional: Negative.   HENT: Negative.   Eyes: Negative.   Respiratory: Negative.   Cardiovascular: Negative.   Gastrointestinal: Negative.   Musculoskeletal: Negative.   Neurological: Negative.   Psychiatric/Behavioral: The patient is nervous/anxious.     Per HPI unless specifically indicated above     Objective:    BP (!) 158/76   Pulse 66   Temp 99 F (37.2 C)   Wt 172 lb (78 kg)   SpO2 96%   BMI 31.46 kg/m   Wt Readings from Last 3 Encounters:  11/26/16 172 lb (78 kg)  09/15/16 168 lb (76.2 kg)  01/07/16 168 lb (76.2 kg)      Physical Exam  Constitutional: She is oriented to person, place, and time. She appears well-developed and well-nourished. No distress.  HENT:  Head: Atraumatic.  Eyes: Conjunctivae are normal. Pupils are equal, round, and reactive to light.  Neck: Normal range of motion. Neck supple.  Cardiovascular: Normal rate, regular rhythm and normal heart sounds.   Pulmonary/Chest: Effort normal and breath sounds normal. No respiratory distress.  Musculoskeletal: Normal range of motion.  Neurological: She is alert and oriented to person, place, and time.  Skin: Skin is warm and dry.  Psychiatric:  Hands slightly shaking at times Tearful toward end of visit  Nursing note and vitals reviewed.     Assessment & Plan:   Problem List Items Addressed This Visit      Other   Anxiety, generalized - Primary    Long discussion with patient about her options. Used shared decision making about next steps - will start Buspar 10 mg BID x 1 week, then increase to TID. Discussed risks and benefits of medication. Also discussed that it not advisable to take benzo's while on chronic narcotic pain medications. Patient understanding and agreeable to plan. Will follow up about how medication is going at her physical in a few weeks          Follow up plan: Return for as scheduled.

## 2016-11-26 NOTE — Patient Instructions (Addendum)
Start taking one 10 mg buspar tablet twice daily for about 1 week, then I will call you and we can discuss increasing to three times daily.

## 2016-12-16 ENCOUNTER — Ambulatory Visit (INDEPENDENT_AMBULATORY_CARE_PROVIDER_SITE_OTHER): Payer: BLUE CROSS/BLUE SHIELD | Admitting: Family Medicine

## 2016-12-16 ENCOUNTER — Encounter: Payer: BLUE CROSS/BLUE SHIELD | Admitting: Family Medicine

## 2016-12-16 ENCOUNTER — Encounter: Payer: Self-pay | Admitting: Family Medicine

## 2016-12-16 VITALS — BP 157/90 | HR 69 | Temp 98.6°F | Resp 17 | Ht 62.0 in | Wt 170.0 lb

## 2016-12-16 DIAGNOSIS — Z1159 Encounter for screening for other viral diseases: Secondary | ICD-10-CM | POA: Diagnosis not present

## 2016-12-16 DIAGNOSIS — Z1231 Encounter for screening mammogram for malignant neoplasm of breast: Secondary | ICD-10-CM

## 2016-12-16 DIAGNOSIS — G894 Chronic pain syndrome: Secondary | ICD-10-CM

## 2016-12-16 DIAGNOSIS — T402X5A Adverse effect of other opioids, initial encounter: Secondary | ICD-10-CM | POA: Diagnosis not present

## 2016-12-16 DIAGNOSIS — E7800 Pure hypercholesterolemia, unspecified: Secondary | ICD-10-CM | POA: Diagnosis not present

## 2016-12-16 DIAGNOSIS — R030 Elevated blood-pressure reading, without diagnosis of hypertension: Secondary | ICD-10-CM

## 2016-12-16 DIAGNOSIS — F411 Generalized anxiety disorder: Secondary | ICD-10-CM

## 2016-12-16 DIAGNOSIS — Z1239 Encounter for other screening for malignant neoplasm of breast: Secondary | ICD-10-CM

## 2016-12-16 DIAGNOSIS — Z124 Encounter for screening for malignant neoplasm of cervix: Secondary | ICD-10-CM

## 2016-12-16 DIAGNOSIS — Z0000 Encounter for general adult medical examination without abnormal findings: Secondary | ICD-10-CM | POA: Diagnosis not present

## 2016-12-16 DIAGNOSIS — K5903 Drug induced constipation: Secondary | ICD-10-CM | POA: Diagnosis not present

## 2016-12-16 DIAGNOSIS — Z1211 Encounter for screening for malignant neoplasm of colon: Secondary | ICD-10-CM | POA: Diagnosis not present

## 2016-12-16 LAB — MICROSCOPIC EXAMINATION: RBC MICROSCOPIC, UA: NONE SEEN /HPF (ref 0–?)

## 2016-12-16 LAB — UA/M W/RFLX CULTURE, ROUTINE
Bilirubin, UA: NEGATIVE
GLUCOSE, UA: NEGATIVE
KETONES UA: NEGATIVE
Leukocytes, UA: NEGATIVE
NITRITE UA: NEGATIVE
RBC, UA: NEGATIVE
Specific Gravity, UA: >1.03 — ABNORMAL HIGH (ref 1.005–1.030)
Urobilinogen, Ur: 0.2 mg/dL (ref 0.2–1.0)
pH, UA: 5 (ref 5.0–7.5)

## 2016-12-16 MED ORDER — NALOXEGOL OXALATE 25 MG PO TABS: 25.0000 mg | ORAL_TABLET | Freq: Every day | ORAL | 3 refills | Status: DC

## 2016-12-16 NOTE — Assessment & Plan Note (Signed)
Rechecking levels today. Continue current regimen. Continue to monitor.  

## 2016-12-16 NOTE — Assessment & Plan Note (Signed)
Continue to follow with pain management. Call with any concerns.  

## 2016-12-16 NOTE — Assessment & Plan Note (Signed)
Will get her started on movantik. Recheck in 1 month.

## 2016-12-16 NOTE — Progress Notes (Signed)
BP (!) 157/90 (BP Location: Left Arm, Cuff Size: Normal)   Pulse 69   Temp 98.6 F (37 C) (Oral)   Resp 17   Ht 5\' 2"  (1.575 m)   Wt 170 lb (77.1 kg)   SpO2 98%   BMI 31.09 kg/m    Subjective:    Patient ID: Jade Smith, female    DOB: February 11, 1956, 61 y.o.   MRN: [id number]  HPI: Carinne Brandenburger is a 61 y.o. female presenting on 12/16/2016 for comprehensive medical examination. Current medical complaints include:  HYPERLIPIDEMIA Hyperlipidemia status: excellent compliance Satisfied with current treatment?  yes Side effects:  no Medication compliance: excellent compliance Past cholesterol meds: atorvastatin Supplements: none Aspirin:  no Chest pain:  no Coronary artery disease:  no Family history CAD:  yes Family history early CAD:  yes  ANXIETY/STRESS- seems to be doing well on the buspar, taking the 2 a day, no funny side effects Duration:better Anxious mood: no  Excessive worrying: no Irritability: no  Sweating: no Nausea: no Palpitations:no Hyperventilation: no Panic attacks: no Agoraphobia: no  Obscessions/compulsions: no Depressed mood: no Depression screen [facility] 2/9 12/16/2016 01/07/2016  Decreased Interest 0 3  Down, Depressed, Hopeless 0 1  PHQ - 2 Score 0 4  Altered sleeping - 0  Tired, decreased energy - 1  Change in appetite - 3  Feeling bad or failure about yourself  - 1  Trouble concentrating - 0  Moving slowly or fidgety/restless - 0  Suicidal thoughts - 0  PHQ-9 Score - 9  Difficult doing work/chores - Very difficult   Anhedonia: no Weight changes: no Insomnia: no   Hypersomnia: no Fatigue/loss of energy: no Feelings of worthlessness: no Feelings of guilt: no Impaired concentration/indecisiveness: no Suicidal ideations: no  Crying spells: no Recent Stressors/Life Changes: no  She currently lives with: Husband Menopausal Symptoms: no  Depression Screen done today and results listed below:  Depression screen [facility] 2/9 12/16/2016  01/07/2016  Decreased Interest 0 3  Down, Depressed, Hopeless 0 1  PHQ - 2 Score 0 4  Altered sleeping - 0  Tired, decreased energy - 1  Change in appetite - 3  Feeling bad or failure about yourself  - 1  Trouble concentrating - 0  Moving slowly or fidgety/restless - 0  Suicidal thoughts - 0  PHQ-9 Score - 9  Difficult doing work/chores - Very difficult   Past Medical History:  Past Medical History:  Diagnosis Date  . Anxiety   . Chronic hip pain (Right) 01/07/2016  . Chronic low back pain (Location of Primary Source of Pain) (Bilateral) (R>L) 01/07/2016  . Chronic lower extremity pain (Location of Secondary source of pain) (Bilateral) (R>L) 01/07/2016   The pain going down both lower extremities appears to be referred from the lower back.   . Chronic sacroiliac joint pain (Right) 01/07/2016  . Fibromyalgia   . Hyperlipidemia   . Migraine   . OAB (overactive bladder)   . OSA (obstructive sleep apnea)   . Sleep apnea     Surgical History:  Past Surgical History:  Procedure Laterality Date  . BREAST BIOPSY Left 02/10/2016   path pending  . BREAST EXCISIONAL BIOPSY Right    surgical bx age 43  . BREAST EXCISIONAL BIOPSY Right    surgical bx  . CARPAL TUNNEL RELEASE Bilateral   . CESAREAN SECTION    . TUBAL LIGATION      Medications:  Current Outpatient Prescriptions on File Prior to Visit  Medication Sig  .  albuterol (PROVENTIL HFA;VENTOLIN HFA) 108 (90 Base) MCG/ACT inhaler Inhale 2 puffs into the lungs every 6 (six) hours as needed for wheezing or shortness of breath.  Marland Kitchen. atorvastatin (LIPITOR) 10 MG tablet Take 10 mg by mouth daily.   . busPIRone (BUSPAR) 10 MG tablet Take 1 tablet (10 mg total) by mouth 2 (two) times daily.  . DULoxetine (CYMBALTA) 60 MG capsule Take 60 mg by mouth daily.  Marland Kitchen. estradiol (ESTRACE) 0.1 MG/GM vaginal cream Place vaginally.  Marland Kitchen. estradiol (ESTRACE) 1 MG tablet Take 1 tablet (1 mg total) by mouth daily.  . fentaNYL (DURAGESIC - DOSED MCG/HR) 50  MCG/HR Place 50 mcg onto the skin.  . medroxyPROGESTERone (PROVERA) 2.5 MG tablet Take 2.5 mg by mouth daily.   Marland Kitchen. oxybutynin (DITROPAN-XL) 10 MG 24 hr tablet Take 1 tablet (10 mg total) by mouth daily.  . SUMAtriptan (IMITREX) 100 MG tablet Take 100 mg by mouth as needed for migraine. May repeat in 2 hours if headache persists or recurs.  . traMADol (ULTRAM) 50 MG tablet Take 50 mg by mouth as needed.    No current facility-administered medications on file prior to visit.     Allergies:  No Known Allergies  Social History:  Social History   Social History  . Marital status: Married    Spouse name: N/A  . Number of children: N/A  . Years of education: N/A   Occupational History  . Not on file.   Social History Main Topics  . Smoking status: Former Games developermoker  . Smokeless tobacco: Never Used  . Alcohol use No  . Drug use: No  . Sexual activity: Not on file   Other Topics Concern  . Not on file   Social History Narrative  . No narrative on file   History  Smoking Status  . Former Smoker  Smokeless Tobacco  . Never Used   History  Alcohol Use No    Family History:  Family History  Problem Relation Age of Onset  . Cancer Mother     mother had hyst to remove cancer not sure what type of cancer   . Diabetes Father   . Cancer Brother     colon  . CAD Brother     Past medical history, surgical history, medications, allergies, family history and social history reviewed with patient today and changes made to appropriate areas of the chart.   Review of Systems  Constitutional: Negative.   HENT: Negative.   Eyes: Negative.   Respiratory: Negative.   Cardiovascular: Negative.   Gastrointestinal: Positive for constipation. Negative for abdominal pain, blood in stool, diarrhea, heartburn, melena, nausea and vomiting.  Genitourinary: Negative.   Musculoskeletal: Positive for myalgias. Negative for back pain, falls, joint pain and neck pain.  Skin: Negative.         Numerous skin tags on her neck  Neurological: Negative.   Endo/Heme/Allergies: Negative.   Psychiatric/Behavioral: Negative.     All other ROS negative except what is listed above and in the HPI.      Objective:    BP (!) 157/90 (BP Location: Left Arm, Cuff Size: Normal)   Pulse 69   Temp 98.6 F (37 C) (Oral)   Resp 17   Ht 5\' 2"  (1.575 m)   Wt 170 lb (77.1 kg)   SpO2 98%   BMI 31.09 kg/m   Wt Readings from Last 3 Encounters:  12/16/16 170 lb (77.1 kg)  11/26/16 172 lb (78 kg)  09/15/16 168  lb (76.2 kg)    Physical Exam  Constitutional: She is oriented to person, place, and time. She appears well-developed and well-nourished. No distress.  HENT:  Head: Normocephalic and atraumatic.  Right Ear: Hearing and external ear normal.  Left Ear: Hearing and external ear normal.  Nose: Nose normal.  Mouth/Throat: Oropharynx is clear and moist. No oropharyngeal exudate.  Eyes: Conjunctivae, EOM and lids are normal. Pupils are equal, round, and reactive to light. Right eye exhibits no discharge. Left eye exhibits no discharge. No scleral icterus.  Neck: Normal range of motion. Neck supple. No JVD present. No tracheal deviation present. No thyromegaly present.  Cardiovascular: Normal rate, regular rhythm, normal heart sounds and intact distal pulses.  Exam reveals no gallop and no friction rub.   No murmur heard. Pulmonary/Chest: Effort normal and breath sounds normal. No stridor. No respiratory distress. She has no wheezes. She has no rales. She exhibits no tenderness. Right breast exhibits no inverted nipple, no mass, no nipple discharge, no skin change and no tenderness. Left breast exhibits no inverted nipple, no mass, no nipple discharge, no skin change and no tenderness. Breasts are symmetrical.  Abdominal: Soft. Bowel sounds are normal. She exhibits no distension and no mass. There is no tenderness. There is no rebound and no guarding. Hernia confirmed negative in the right inguinal  area and confirmed negative in the left inguinal area.  Genitourinary: Vagina normal and uterus normal. No breast swelling, tenderness, discharge or bleeding. No labial fusion. There is no rash, tenderness, lesion or injury on the right labia. There is no rash, tenderness, lesion or injury on the left labia. Uterus is not deviated, not enlarged, not fixed and not tender. Cervix exhibits no motion tenderness, no discharge and no friability. Right adnexum displays no mass, no tenderness and no fullness. Left adnexum displays no mass, no tenderness and no fullness. No erythema, tenderness or bleeding in the vagina. No foreign body in the vagina. No signs of injury around the vagina. No vaginal discharge found.  Musculoskeletal: Normal range of motion. She exhibits no edema, tenderness or deformity.  Lymphadenopathy:    She has no cervical adenopathy.  Neurological: She is alert and oriented to person, place, and time. She has normal reflexes. She displays normal reflexes. No cranial nerve deficit. She exhibits normal muscle tone. Coordination normal.  Skin: Skin is warm, dry and intact. No rash noted. She is not diaphoretic. No erythema. No pallor.  Psychiatric: She has a normal mood and affect. Her speech is normal and behavior is normal. Judgment and thought content normal. Cognition and memory are normal.  Nursing note and vitals reviewed.      Assessment & Plan:   Problem List Items Addressed This Visit      Digestive   Therapeutic opioid induced constipation    Will get her started on movantik. Recheck in 1 month.         Other   Chronic pain (Chronic)    Continue to follow with pain management. Call with any concerns.       Anxiety, generalized    Doing very well on her current regimen. Continue current regimen. Continue to monitor. Call with any concerns.       Relevant Orders   CBC with Differential/Platelet   TSH   Ambulatory referral to Gastroenterology   Pure  hypercholesterolemia    Rechecking levels today. Continue current regimen. Continue to monitor.       Relevant Orders   Comprehensive metabolic panel  Lipid Panel w/o Chol/HDL Ratio    Other Visit Diagnoses    Routine general medical examination at a health care facility    -  Primary   Up to date on vaccines. Screening labs checked today. Pap done today. Mammogram and colonscopy ordered. Continue diet and exercise.    Relevant Orders   CBC with Differential/Platelet   Comprehensive metabolic panel   Lipid Panel w/o Chol/HDL Ratio   TSH   UA/M w/rflx Culture, Routine   Hepatitis C Antibody   Need for hepatitis C screening test       Labs drawn today. Await results.    Relevant Orders   Hepatitis C Antibody   Screening for cervical cancer       Pap done today.   Relevant Orders   IGP, Aptima HPV, rfx 16/18,45   Screening for colon cancer       Referral to GI made today.   Screening for breast cancer       Mammogram due in April- ordered today.   Relevant Orders   MM DIGITAL SCREENING BILATERAL   Elevated BP without diagnosis of hypertension           Follow up plan: Return in about 4 weeks (around 01/13/2017) for follow up constipation.   LABORATORY TESTING:  - Pap smear: pap done  IMMUNIZATIONS:   - Tdap: Tetanus vaccination status reviewed: last tetanus booster within 10 years. - Influenza: Up to date - Pneumovax: Given elsewhere - Prevnar: Not applicable - Zostavax vaccine: Will wait to next year  SCREENING: -Mammogram: Up to date- ordered today - Colonoscopy: Ordered today   PATIENT COUNSELING:   Advised to take 1 mg of folate supplement per day if capable of pregnancy.   Sexuality: Discussed sexually transmitted diseases, partner selection, use of condoms, avoidance of unintended pregnancy  and contraceptive alternatives.   Advised to avoid cigarette smoking.  I discussed with the patient that most people either abstain from alcohol or drink within  safe limits (<=14/week and <=4 drinks/occasion for males, <=7/weeks and <= 3 drinks/occasion for females) and that the risk for alcohol disorders and other health effects rises proportionally with the number of drinks per week and how often a drinker exceeds daily limits.  Discussed cessation/primary prevention of drug use and availability of treatment for abuse.   Diet: Encouraged to adjust caloric intake to maintain  or achieve ideal body weight, to reduce intake of dietary saturated fat and total fat, to limit sodium intake by avoiding high sodium foods and not adding table salt, and to maintain adequate dietary potassium and calcium preferably from fresh fruits, vegetables, and low-fat dairy products.    stressed the importance of regular exercise  Injury prevention: Discussed safety belts, safety helmets, smoke detector, smoking near bedding or upholstery.   Dental health: Discussed importance of regular tooth brushing, flossing, and dental visits.    NEXT PREVENTATIVE PHYSICAL DUE IN 1 YEAR. Return in about 4 weeks (around 01/13/2017) for follow up constipation.

## 2016-12-16 NOTE — Patient Instructions (Addendum)
Health Maintenance, Female Adopting a healthy lifestyle and getting preventive care can go a long way to promote health and wellness. Talk with your health care provider about what schedule of regular examinations is right for you. This is a good chance for you to check in with your provider about disease prevention and staying healthy. In between checkups, there are plenty of things you can do on your own. Experts have done a lot of research about which lifestyle changes and preventive measures are most likely to keep you healthy. Ask your health care provider for more information. Weight and diet Eat a healthy diet  Be sure to include plenty of vegetables, fruits, low-fat dairy products, and lean protein.  Do not eat a lot of foods high in solid fats, added sugars, or salt.  Get regular exercise. This is one of the most important things you can do for your health.  Most adults should exercise for at least 150 minutes each week. The exercise should increase your heart rate and make you sweat (moderate-intensity exercise).  Most adults should also do strengthening exercises at least twice a week. This is in addition to the moderate-intensity exercise. Maintain a healthy weight  Body mass index (BMI) is a measurement that can be used to identify possible weight problems. It estimates body fat based on height and weight. Your health care provider can help determine your BMI and help you achieve or maintain a healthy weight.  For females 76 years of age and older:  A BMI below 18.5 is considered underweight.  A BMI of 18.5 to 24.9 is normal.  A BMI of 25 to 29.9 is considered overweight.  A BMI of 30 and above is considered obese. Watch levels of cholesterol and blood lipids  You should start having your blood tested for lipids and cholesterol at 60 years of age, then have this test every 5 years.  You may need to have your cholesterol levels checked more often if:  Your lipid or  cholesterol levels are high.  You are older than 60 years of age.  You are at high risk for heart disease. Cancer screening Lung Cancer  Lung cancer screening is recommended for adults 64-42 years old who are at high risk for lung cancer because of a history of smoking.  A yearly low-dose CT scan of the lungs is recommended for people who:  Currently smoke.  Have quit within the past 15 years.  Have at least a 30-pack-year history of smoking. A pack year is smoking an average of one pack of cigarettes a day for 1 year.  Yearly screening should continue until it has been 15 years since you quit.  Yearly screening should stop if you develop a health problem that would prevent you from having lung cancer treatment. Breast Cancer  Practice breast self-awareness. This means understanding how your breasts normally appear and feel.  It also means doing regular breast self-exams. Let your health care provider know about any changes, no matter how small.  If you are in your 20s or 30s, you should have a clinical breast exam (CBE) by a health care provider every 1-3 years as part of a regular health exam.  If you are 34 or older, have a CBE every year. Also consider having a breast X-ray (mammogram) every year.  If you have a family history of breast cancer, talk to your health care provider about genetic screening.  If you are at high risk for breast cancer, talk  to your health care provider about having an MRI and a mammogram every year.  Breast cancer gene (BRCA) assessment is recommended for women who have family members with BRCA-related cancers. BRCA-related cancers include:  Breast.  Ovarian.  Tubal.  Peritoneal cancers.  Results of the assessment will determine the need for genetic counseling and BRCA1 and BRCA2 testing. Cervical Cancer  Your health care provider may recommend that you be screened regularly for cancer of the pelvic organs (ovaries, uterus, and vagina).  This screening involves a pelvic examination, including checking for microscopic changes to the surface of your cervix (Pap test). You may be encouraged to have this screening done every 3 years, beginning at age 24.  For women ages 66-65, health care providers may recommend pelvic exams and Pap testing every 3 years, or they may recommend the Pap and pelvic exam, combined with testing for human papilloma virus (HPV), every 5 years. Some types of HPV increase your risk of cervical cancer. Testing for HPV may also be done on women of any age with unclear Pap test results.  Other health care providers may not recommend any screening for nonpregnant women who are considered low risk for pelvic cancer and who do not have symptoms. Ask your health care provider if a screening pelvic exam is right for you.  If you have had past treatment for cervical cancer or a condition that could lead to cancer, you need Pap tests and screening for cancer for at least 20 years after your treatment. If Pap tests have been discontinued, your risk factors (such as having a new sexual partner) need to be reassessed to determine if screening should resume. Some women have medical problems that increase the chance of getting cervical cancer. In these cases, your health care provider may recommend more frequent screening and Pap tests. Colorectal Cancer  This type of cancer can be detected and often prevented.  Routine colorectal cancer screening usually begins at 61 years of age and continues through 60 years of age.  Your health care provider may recommend screening at an earlier age if you have risk factors for colon cancer.  Your health care provider may also recommend using home test kits to check for hidden blood in the stool.  A small camera at the end of a tube can be used to examine your colon directly (sigmoidoscopy or colonoscopy). This is done to check for the earliest forms of colorectal cancer.  Routine  screening usually begins at age 41.  Direct examination of the colon should be repeated every 5-10 years through 60 years of age. However, you may need to be screened more often if early forms of precancerous polyps or small growths are found. Skin Cancer  Check your skin from head to toe regularly.  Tell your health care provider about any new moles or changes in moles, especially if there is a change in a mole's shape or color.  Also tell your health care provider if you have a mole that is larger than the size of a pencil eraser.  Always use sunscreen. Apply sunscreen liberally and repeatedly throughout the day.  Protect yourself by wearing long sleeves, pants, a wide-brimmed hat, and sunglasses whenever you are outside. Heart disease, diabetes, and high blood pressure  High blood pressure causes heart disease and increases the risk of stroke. High blood pressure is more likely to develop in:  People who have blood pressure in the high end of the normal range (130-139/85-89 mm Hg).  People who are overweight or obese.  People who are African American.  If you are 59-24 years of age, have your blood pressure checked every 3-5 years. If you are 34 years of age or older, have your blood pressure checked every year. You should have your blood pressure measured twice-once when you are at a hospital or clinic, and once when you are not at a hospital or clinic. Record the average of the two measurements. To check your blood pressure when you are not at a hospital or clinic, you can use:  An automated blood pressure machine at a pharmacy.  A home blood pressure monitor.  If you are between 29 years and 60 years old, ask your health care provider if you should take aspirin to prevent strokes.  Have regular diabetes screenings. This involves taking a blood sample to check your fasting blood sugar level.  If you are at a normal weight and have a low risk for diabetes, have this test once  every three years after 60 years of age.  If you are overweight and have a high risk for diabetes, consider being tested at a younger age or more often. Preventing infection Hepatitis B  If you have a higher risk for hepatitis B, you should be screened for this virus. You are considered at high risk for hepatitis B if:  You were born in a country where hepatitis B is common. Ask your health care provider which countries are considered high risk.  Your parents were born in a high-risk country, and you have not been immunized against hepatitis B (hepatitis B vaccine).  You have HIV or AIDS.  You use needles to inject street drugs.  You live with someone who has hepatitis B.  You have had sex with someone who has hepatitis B.  You get hemodialysis treatment.  You take certain medicines for conditions, including cancer, organ transplantation, and autoimmune conditions. Hepatitis C  Blood testing is recommended for:  Everyone born from [birth year] through 1964.  Anyone with known risk factors for hepatitis C. Sexually transmitted infections (STIs)  You should be screened for sexually transmitted infections (STIs) including gonorrhea and chlamydia if:  You are sexually active and are younger than 60 years of age.  You are older than 60 years of age and your health care provider tells you that you are at risk for this type of infection.  Your sexual activity has changed since you were last screened and you are at an increased risk for chlamydia or gonorrhea. Ask your health care provider if you are at risk.  If you do not have HIV, but are at risk, it may be recommended that you take a prescription medicine daily to prevent HIV infection. This is called pre-exposure prophylaxis (PrEP). You are considered at risk if:  You are sexually active and do not regularly use condoms or know the HIV status of your partner(s).  You take drugs by injection.  You are sexually active with a partner  who has HIV. Talk with your health care provider about whether you are at high risk of being infected with HIV. If you choose to begin PrEP, you should first be tested for HIV. You should then be tested every 3 months for as long as you are taking PrEP. Pregnancy  If you are premenopausal and you may become pregnant, ask your health care provider about preconception counseling.  If you may become pregnant, take 400 to 800 micrograms (mcg) of folic acid  every day.  If you want to prevent pregnancy, talk to your health care provider about birth control (contraception). Osteoporosis and menopause  Osteoporosis is a disease in which the bones lose minerals and strength with aging. This can result in serious bone fractures. Your risk for osteoporosis can be identified using a bone density scan.  If you are 63 years of age or older, or if you are at risk for osteoporosis and fractures, ask your health care provider if you should be screened.  Ask your health care provider whether you should take a calcium or vitamin D supplement to lower your risk for osteoporosis.  Menopause may have certain physical symptoms and risks.  Hormone replacement therapy may reduce some of these symptoms and risks. Talk to your health care provider about whether hormone replacement therapy is right for you. Follow these instructions at home:  Schedule regular health, dental, and eye exams.  Stay current with your immunizations.  Do not use any tobacco products including cigarettes, chewing tobacco, or electronic cigarettes.  If you are pregnant, do not drink alcohol.  If you are breastfeeding, limit how much and how often you drink alcohol.  Limit alcohol intake to no more than 1 drink per day for nonpregnant women. One drink equals 12 ounces of beer, 5 ounces of wine, or 1 ounces of hard liquor.  Do not use street drugs.  Do not share needles.  Ask your health care provider for help if you need support  or information about quitting drugs.  Tell your health care provider if you often feel depressed.  Tell your health care provider if you have ever been abused or do not feel safe at home. This information is not intended to replace advice given to you by your health care provider. Make sure you discuss any questions you have with your health care provider. Document Released: 04/20/2011 Document Revised: 03/12/2016 Document Reviewed: 07/09/2015 Elsevier Interactive Patient Education  2017 [place] Maintenance for Postmenopausal Women Menopause is a normal process in which your reproductive ability comes to an end. This process happens gradually over a span of months to years, usually between the ages of 48 and 29. Menopause is complete when you have missed 12 consecutive menstrual periods. It is important to talk with your health care provider about some of the most common conditions that affect postmenopausal women, such as heart disease, cancer, and bone loss (osteoporosis). Adopting a healthy lifestyle and getting preventive care can help to promote your health and wellness. Those actions can also lower your chances of developing some of these common conditions. What should I know about menopause? During menopause, you may experience a number of symptoms, such as:  Moderate-to-severe hot flashes.  Night sweats.  Decrease in sex drive.  Mood swings.  Headaches.  Tiredness.  Irritability.  Memory problems.  Insomnia. Choosing to treat or not to treat menopausal changes is an individual decision that you make with your health care provider. What should I know about hormone replacement therapy and supplements? Hormone therapy products are effective for treating symptoms that are associated with menopause, such as hot flashes and night sweats. Hormone replacement carries certain risks, especially as you become older. If you are thinking about using estrogen or estrogen  with progestin treatments, discuss the benefits and risks with your health care provider. What should I know about heart disease and stroke? Heart disease, heart attack, and stroke become more likely as you age. This may be due, in  part, to the hormonal changes that your body experiences during menopause. These can affect how your body processes dietary fats, triglycerides, and cholesterol. Heart attack and stroke are both medical emergencies. There are many things that you can do to help prevent heart disease and stroke:  Have your blood pressure checked at least every 1-2 years. High blood pressure causes heart disease and increases the risk of stroke.  If you are 54-61 years old, ask your health care provider if you should take aspirin to prevent a heart attack or a stroke.  Do not use any tobacco products, including cigarettes, chewing tobacco, or electronic cigarettes. If you need help quitting, ask your health care provider.  It is important to eat a healthy diet and maintain a healthy weight.  Be sure to include plenty of vegetables, fruits, low-fat dairy products, and lean protein.  Avoid eating foods that are high in solid fats, added sugars, or salt (sodium).  Get regular exercise. This is one of the most important things that you can do for your health.  Try to exercise for at least 150 minutes each week. The type of exercise that you do should increase your heart rate and make you sweat. This is known as moderate-intensity exercise.  Try to do strengthening exercises at least twice each week. Do these in addition to the moderate-intensity exercise.  Know your numbers.Ask your health care provider to check your cholesterol and your blood glucose. Continue to have your blood tested as directed by your health care provider. What should I know about cancer screening? There are several types of cancer. Take the following steps to reduce your risk and to catch any cancer development  as early as possible. Breast Cancer  Practice breast self-awareness.  This means understanding how your breasts normally appear and feel.  It also means doing regular breast self-exams. Let your health care provider know about any changes, no matter how small.  If you are 63 or older, have a clinician do a breast exam (clinical breast exam or CBE) every year. Depending on your age, family history, and medical history, it may be recommended that you also have a yearly breast X-ray (mammogram).  If you have a family history of breast cancer, talk with your health care provider about genetic screening.  If you are at high risk for breast cancer, talk with your health care provider about having an MRI and a mammogram every year.  Breast cancer (BRCA) gene test is recommended for women who have family members with BRCA-related cancers. Results of the assessment will determine the need for genetic counseling and BRCA1 and for BRCA2 testing. BRCA-related cancers include these types:  Breast. This occurs in males or females.  Ovarian.  Tubal. This may also be called fallopian tube cancer.  Cancer of the abdominal or pelvic lining (peritoneal cancer).  Prostate.  Pancreatic. Cervical, Uterine, and Ovarian Cancer  Your health care provider may recommend that you be screened regularly for cancer of the pelvic organs. These include your ovaries, uterus, and vagina. This screening involves a pelvic exam, which includes checking for microscopic changes to the surface of your cervix (Pap test).  For women ages 21-65, health care providers may recommend a pelvic exam and a Pap test every three years. For women ages 61-65, they may recommend the Pap test and pelvic exam, combined with testing for human papilloma virus (HPV), every five years. Some types of HPV increase your risk of cervical cancer. Testing for HPV may  also be done on women of any age who have unclear Pap test results.  Other health  care providers may not recommend any screening for nonpregnant women who are considered low risk for pelvic cancer and have no symptoms. Ask your health care provider if a screening pelvic exam is right for you.  If you have had past treatment for cervical cancer or a condition that could lead to cancer, you need Pap tests and screening for cancer for at least 20 years after your treatment. If Pap tests have been discontinued for you, your risk factors (such as having a new sexual partner) need to be reassessed to determine if you should start having screenings again. Some women have medical problems that increase the chance of getting cervical cancer. In these cases, your health care provider may recommend that you have screening and Pap tests more often.  If you have a family history of uterine cancer or ovarian cancer, talk with your health care provider about genetic screening.  If you have vaginal bleeding after reaching menopause, tell your health care provider.  There are currently no reliable tests available to screen for ovarian cancer. Lung Cancer  Lung cancer screening is recommended for adults 61-47 years old who are at high risk for lung cancer because of a history of smoking. A yearly low-dose CT scan of the lungs is recommended if you:  Currently smoke.  Have a history of at least 30 pack-years of smoking and you currently smoke or have quit within the past 15 years. A pack-year is smoking an average of one pack of cigarettes per day for one year. Yearly screening should:  Continue until it has been 15 years since you quit.  Stop if you develop a health problem that would prevent you from having lung cancer treatment. Colorectal Cancer  This type of cancer can be detected and can often be prevented.  Routine colorectal cancer screening usually begins at age 2 and continues through age 50.  If you have risk factors for colon cancer, your health care provider may recommend  that you be screened at an earlier age.  If you have a family history of colorectal cancer, talk with your health care provider about genetic screening.  Your health care provider may also recommend using home test kits to check for hidden blood in your stool.  A small camera at the end of a tube can be used to examine your colon directly (sigmoidoscopy or colonoscopy). This is done to check for the earliest forms of colorectal cancer.  Direct examination of the colon should be repeated every 5-10 years until age 54. However, if early forms of precancerous polyps or small growths are found or if you have a family history or genetic risk for colorectal cancer, you may need to be screened more often. Skin Cancer  Check your skin from head to toe regularly.  Monitor any moles. Be sure to tell your health care provider:  About any new moles or changes in moles, especially if there is a change in a mole's shape or color.  If you have a mole that is larger than the size of a pencil eraser.  If any of your family members has a history of skin cancer, especially at a young age, talk with your health care provider about genetic screening.  Always use sunscreen. Apply sunscreen liberally and repeatedly throughout the day.  Whenever you are outside, protect yourself by wearing long sleeves, pants, a wide-brimmed hat, and  sunglasses. What should I know about osteoporosis? Osteoporosis is a condition in which bone destruction happens more quickly than new bone creation. After menopause, you may be at an increased risk for osteoporosis. To help prevent osteoporosis or the bone fractures that can happen because of osteoporosis, the following is recommended:  If you are 19-7 years old, get at least 1,000 mg of calcium and at least 600 mg of vitamin D per day.  If you are older than age 69 but younger than age 27, get at least 1,200 mg of calcium and at least 600 mg of vitamin D per day.  If you are  older than age 84, get at least 1,200 mg of calcium and at least 800 mg of vitamin D per day. Smoking and excessive alcohol intake increase the risk of osteoporosis. Eat foods that are rich in calcium and vitamin D, and do weight-bearing exercises several times each week as directed by your health care provider. What should I know about how menopause affects my mental health? Depression may occur at any age, but it is more common as you become older. Common symptoms of depression include:  Low or sad mood.  Changes in sleep patterns.  Changes in appetite or eating patterns.  Feeling an overall lack of motivation or enjoyment of activities that you previously enjoyed.  Frequent crying spells. Talk with your health care provider if you think that you are experiencing depression. What should I know about immunizations? It is important that you get and maintain your immunizations. These include:  Tetanus, diphtheria, and pertussis (Tdap) booster vaccine.  Influenza every year before the flu season begins.  Pneumonia vaccine.  Shingles vaccine. Your health care provider may also recommend other immunizations. This information is not intended to replace advice given to you by your health care provider. Make sure you discuss any questions you have with your health care provider. Document Released: 11/27/2005 Document Revised: 04/24/2016 Document Reviewed: 07/09/2015 Elsevier Interactive Patient Education  2017 [place] DASH stands for "Dietary Approaches to Stop Hypertension." The DASH eating plan is a healthy eating plan that has been shown to reduce high blood pressure (hypertension). It may also reduce your risk for type 2 diabetes, heart disease, and stroke. The DASH eating plan may also help with weight loss. What are tips for following this plan? General guidelines   Avoid eating more than 2,300 mg (milligrams) of salt (sodium) a day. If you have  hypertension, you may need to reduce your sodium intake to 1,500 mg a day.  Limit alcohol intake to no more than 1 drink a day for nonpregnant women and 2 drinks a day for men. One drink equals 12 oz of beer, 5 oz of wine, or 1 oz of hard liquor.  Work with your health care provider to maintain a healthy body weight or to lose weight. Ask what an ideal weight is for you.  Get at least 30 minutes of exercise that causes your heart to beat faster (aerobic exercise) most days of the week. Activities may include walking, swimming, or biking.  Work with your health care provider or diet and nutrition specialist (dietitian) to adjust your eating plan to your individual calorie needs. Reading food labels   Check food labels for the amount of sodium per serving. Choose foods with less than 5 percent of the Daily Value of sodium. Generally, foods with less than 300 mg of sodium per serving fit into this eating plan.  To find whole grains, look for the word "whole" as the first word in the ingredient list. Shopping   Buy products labeled as "low-sodium" or "no salt added."  Buy fresh foods. Avoid canned foods and premade or frozen meals. Cooking   Avoid adding salt when cooking. Use salt-free seasonings or herbs instead of table salt or sea salt. Check with your health care provider or pharmacist before using salt substitutes.  Do not fry foods. Cook foods using healthy methods such as baking, boiling, grilling, and broiling instead.  Cook with heart-healthy oils, such as olive, canola, soybean, or sunflower oil. Meal planning    Eat a balanced diet that includes:  5 or more servings of fruits and vegetables each day. At each meal, try to fill half of your plate with fruits and vegetables.  Up to 6-8 servings of whole grains each day.  Less than 6 oz of lean meat, poultry, or fish each day. A 3-oz serving of meat is about the same size as a deck of cards. One egg equals 1 oz.  2 servings  of low-fat dairy each day.  A serving of nuts, seeds, or beans 5 times each week.  Heart-healthy fats. Healthy fats called Omega-3 fatty acids are found in foods such as flaxseeds and coldwater fish, like sardines, salmon, and mackerel.  Limit how much you eat of the following:  Canned or prepackaged foods.  Food that is high in trans fat, such as fried foods.  Food that is high in saturated fat, such as fatty meat.  Sweets, desserts, sugary drinks, and other foods with added sugar.  Full-fat dairy products.  Do not salt foods before eating.  Try to eat at least 2 vegetarian meals each week.  Eat more home-cooked food and less restaurant, buffet, and fast food.  When eating at a restaurant, ask that your food be prepared with less salt or no salt, if possible. What foods are recommended? The items listed may not be a complete list. Talk with your dietitian about what dietary choices are best for you. Grains  Whole-grain or whole-wheat bread. Whole-grain or whole-wheat pasta. Brown rice. [place]. Bulgur. Whole-grain and low-sodium cereals. Pita bread. Low-fat, low-sodium crackers. Whole-wheat flour tortillas. Vegetables  Fresh or frozen vegetables (raw, steamed, roasted, or grilled). Low-sodium or reduced-sodium tomato and vegetable juice. Low-sodium or reduced-sodium tomato sauce and tomato paste. Low-sodium or reduced-sodium canned vegetables. Fruits  All fresh, dried, or frozen fruit. Canned fruit in natural juice (without added sugar). Meat and other protein foods  Skinless chicken or [place]. Ground chicken or [place]. Pork with fat trimmed off. Fish and seafood. Egg whites. Dried beans, peas, or lentils. Unsalted nuts, nut butters, and seeds. Unsalted canned beans. Lean cuts of beef with fat trimmed off. Low-sodium, lean deli meat. Dairy  Low-fat (1%) or fat-free (skim) milk. Fat-free, low-fat, or reduced-fat cheeses. Nonfat, low-sodium ricotta or cottage cheese.  Low-fat or nonfat yogurt. Low-fat, low-sodium cheese. Fats and oils  Soft margarine without trans fats. Vegetable oil. Low-fat, reduced-fat, or light mayonnaise and salad dressings (reduced-sodium). Canola, safflower, olive, soybean, and sunflower oils. Avocado. Seasoning and other foods  Herbs. Spices. Seasoning mixes without salt. Unsalted popcorn and pretzels. Fat-free sweets. What foods are not recommended? The items listed may not be a complete list. Talk with your dietitian about what dietary choices are best for you. Grains  Baked goods made with fat, such as croissants, muffins, or some breads. Dry pasta or rice meal packs. Vegetables  Creamed or fried vegetables. Vegetables in a cheese sauce. Regular canned vegetables (not low-sodium or reduced-sodium). Regular canned tomato sauce and paste (not low-sodium or reduced-sodium). Regular tomato and vegetable juice (not low-sodium or reduced-sodium). Angie Fava. Olives. Fruits  Canned fruit in a light or heavy syrup. Fried fruit. Fruit in cream or butter sauce. Meat and other protein foods  Fatty cuts of meat. Ribs. Fried meat. Berniece Salines. Sausage. Bologna and other processed lunch meats. Salami. Fatback. Hotdogs. Bratwurst. Salted nuts and seeds. Canned beans with added salt. Canned or smoked fish. Whole eggs or egg yolks. Chicken or [place] with skin. Dairy  Whole or 2% milk, cream, and half-and-half. Whole or full-fat cream cheese. Whole-fat or sweetened yogurt. Full-fat cheese. Nondairy creamers. Whipped toppings. Processed cheese and cheese spreads. Fats and oils  Butter. Stick margarine. Lard. Shortening. Ghee. Bacon fat. Tropical oils, such as coconut, palm kernel, or palm oil. Seasoning and other foods  Salted popcorn and pretzels. Onion salt, garlic salt, seasoned salt, table salt, and sea salt. Worcestershire sauce. Tartar sauce. Barbecue sauce. Teriyaki sauce. Soy sauce, including reduced-sodium. Steak sauce. Canned and packaged gravies.  Fish sauce. Oyster sauce. Cocktail sauce. Horseradish that you find on the shelf. Ketchup. Mustard. Meat flavorings and tenderizers. Bouillon cubes. Hot sauce and Tabasco sauce. Premade or packaged marinades. Premade or packaged taco seasonings. Relishes. Regular salad dressings. Where to find more information:  National Heart, Lung, and [place]: https://wilson-eaton.com/  American Heart Association: www.heart.org Summary  The DASH eating plan is a healthy eating plan that has been shown to reduce high blood pressure (hypertension). It may also reduce your risk for type 2 diabetes, heart disease, and stroke.  With the DASH eating plan, you should limit salt (sodium) intake to 2,300 mg a day. If you have hypertension, you may need to reduce your sodium intake to 1,500 mg a day.  When on the DASH eating plan, aim to eat more fresh fruits and vegetables, whole grains, lean proteins, low-fat dairy, and heart-healthy fats.  Work with your health care provider or diet and nutrition specialist (dietitian) to adjust your eating plan to your individual calorie needs. This information is not intended to replace advice given to you by your health care provider. Make sure you discuss any questions you have with your health care provider. Document Released: 09/24/2011 Document Revised: 09/28/2016 Document Reviewed: 09/28/2016 Elsevier Interactive Patient Education  2017 [employer].

## 2016-12-16 NOTE — Assessment & Plan Note (Signed)
Doing very well on her current regimen. Continue current regimen. Continue to monitor. Call with any concerns.

## 2016-12-17 ENCOUNTER — Encounter: Payer: Self-pay | Admitting: Family Medicine

## 2016-12-17 LAB — COMPREHENSIVE METABOLIC PANEL
A/G RATIO: 1.9 (ref 1.2–2.2)
ALBUMIN: 4.4 g/dL (ref 3.5–5.5)
ALK PHOS: 74 IU/L (ref 39–117)
ALT: 19 IU/L (ref 0–32)
AST: 18 IU/L (ref 0–40)
BILIRUBIN TOTAL: 0.8 mg/dL (ref 0.0–1.2)
BUN / CREAT RATIO: 22 (ref 9–23)
BUN: 17 mg/dL (ref 6–24)
CHLORIDE: 104 mmol/L (ref 96–106)
CO2: 23 mmol/L (ref 18–29)
Calcium: 9 mg/dL (ref 8.7–10.2)
Creatinine, Ser: 0.79 mg/dL (ref 0.57–1.00)
GFR calc non Af Amer: 82 mL/min/{1.73_m2} (ref >59)
GFR, EST AFRICAN AMERICAN: 95 mL/min/{1.73_m2} (ref >59)
GLUCOSE: 103 mg/dL — AB (ref 65–99)
Globulin, Total: 2.3 g/dL (ref 1.5–4.5)
POTASSIUM: 4.9 mmol/L (ref 3.5–5.2)
SODIUM: 142 mmol/L (ref 134–144)
Total Protein: 6.7 g/dL (ref 6.0–8.5)

## 2016-12-17 LAB — CBC WITH DIFFERENTIAL/PLATELET
BASOS ABS: 0 10*3/uL (ref 0.0–0.2)
BASOS: 0 %
EOS (ABSOLUTE): 0.2 10*3/uL (ref 0.0–0.4)
Eos: 2 %
HEMOGLOBIN: 14.4 g/dL (ref 11.1–15.9)
Hematocrit: 42.3 % (ref 34.0–46.6)
Immature Grans (Abs): 0 10*3/uL (ref 0.0–0.1)
Immature Granulocytes: 0 %
LYMPHS ABS: 2.2 10*3/uL (ref 0.7–3.1)
Lymphs: 32 %
MCH: 31.3 pg (ref 26.6–33.0)
MCHC: 34 g/dL (ref 31.5–35.7)
MCV: 92 fL (ref 79–97)
MONOCYTES: 7 %
Monocytes Absolute: 0.5 10*3/uL (ref 0.1–0.9)
NEUTROS ABS: 4.2 10*3/uL (ref 1.4–7.0)
Neutrophils: 59 %
Platelets: 217 10*3/uL (ref 150–379)
RBC: 4.6 x10E6/uL (ref 3.77–5.28)
RDW: 13.5 % (ref 12.3–15.4)
WBC: 7.1 10*3/uL (ref 3.4–10.8)

## 2016-12-17 LAB — LIPID PANEL W/O CHOL/HDL RATIO
CHOLESTEROL TOTAL: 146 mg/dL (ref 100–199)
HDL: 41 mg/dL (ref >39)
LDL Calculated: 91 mg/dL (ref 0–99)
Triglycerides: 70 mg/dL (ref 0–149)
VLDL Cholesterol Cal: 14 mg/dL (ref 5–40)

## 2016-12-17 LAB — TSH: TSH: 1.99 u[IU]/mL (ref 0.450–4.500)

## 2016-12-17 LAB — HEPATITIS C ANTIBODY

## 2016-12-18 LAB — IGP, APTIMA HPV, RFX 16/18,45
HPV APTIMA: NEGATIVE
PAP SMEAR COMMENT: 0

## 2016-12-29 ENCOUNTER — Telehealth: Payer: Self-pay | Admitting: Family Medicine

## 2016-12-29 ENCOUNTER — Encounter: Payer: BLUE CROSS/BLUE SHIELD | Admitting: Family Medicine

## 2016-12-29 NOTE — Telephone Encounter (Signed)
Patient had discussed with Fleet Contrasachel at her last visit about the skin tags, she or her husband have some information they would like to discuss with either Fleet Contrasachel or Amy regarding this.  Thanks

## 2016-12-30 NOTE — Telephone Encounter (Signed)
They called the insurance company and said as long as it isn't for cosmetic reasons, they will cover it. It needs to be coded as they are bothering her. The seatbelt rubs it.  Appt scheduled to come in for removal.

## 2016-12-30 NOTE — Telephone Encounter (Signed)
Left message to call.

## 2017-01-01 ENCOUNTER — Encounter: Payer: Self-pay | Admitting: Family Medicine

## 2017-01-01 ENCOUNTER — Ambulatory Visit (INDEPENDENT_AMBULATORY_CARE_PROVIDER_SITE_OTHER): Payer: BLUE CROSS/BLUE SHIELD | Admitting: Family Medicine

## 2017-01-01 VITALS — BP 145/79 | HR 72 | Temp 98.2°F | Wt 171.0 lb

## 2017-01-01 DIAGNOSIS — R42 Dizziness and giddiness: Secondary | ICD-10-CM

## 2017-01-01 MED ORDER — ONDANSETRON 4 MG PO TBDP: 4.0000 mg | ORAL_TABLET | Freq: Three times a day (TID) | ORAL | 0 refills | Status: DC | PRN

## 2017-01-01 MED ORDER — MECLIZINE HCL 25 MG PO TABS: 25.0000 mg | ORAL_TABLET | Freq: Three times a day (TID) | ORAL | 1 refills | Status: DC | PRN

## 2017-01-01 NOTE — Patient Instructions (Addendum)

## 2017-01-01 NOTE — Progress Notes (Signed)
   BP (!) 145/79   Pulse 72   Temp 98.2 F (36.8 C)   Wt 171 lb (77.6 kg)   SpO2 97%   BMI 31.28 kg/m    Subjective:    Patient ID: Jade Smith, female    DOB: 01/18/1956, [age] y.o.   MRN: [id number]  HPI: Jade KocherRebecca Bender is a 61 y.o. female  Chief Complaint  Patient presents with  . Dizziness    This morning was on the computer and room started spinning and she fell out of her chair. It has resolved now. Had not yet ate this morning when it happened. Was dx with it in 11/16.    Patient presents for an episode of dizziness that occurred earlier this morning. This was an isolated event and only lasted less than one minute with no recurrences. Pt states she was sitting in her chair and suddenly the room started spinning and she fell out of the chair onto a dog bed. No injuries during the fall, and dizziness dissipated fully. Had period of nausea after event but did not vomit. Notes she had not yet eaten anything when episode occurred. Denies syncope, head impact, visual disturbance, HA, weakness, CP, or ear pain. Has not tried anything OTC for sxs.   Relevant past medical, surgical, family and social history reviewed and updated as indicated. Interim medical history since our last visit reviewed. Allergies and medications reviewed and updated.  Review of Systems  Constitutional: Negative.   HENT: Negative.  Negative for ear pain and tinnitus.   Eyes: Negative.   Respiratory: Negative.   Cardiovascular: Negative.   Gastrointestinal: Negative.   Genitourinary: Negative.   Musculoskeletal: Negative.   Neurological: Positive for dizziness and light-headedness.  Psychiatric/Behavioral: Negative.     Per HPI unless specifically indicated above     Objective:    BP (!) 145/79   Pulse 72   Temp 98.2 F (36.8 C)   Wt 171 lb (77.6 kg)   SpO2 97%   BMI 31.28 kg/m   Wt Readings from Last 3 Encounters:  01/01/17 171 lb (77.6 kg)  12/16/16 170 lb (77.1 kg)  11/26/16 172 lb  (78 kg)    Physical Exam  Constitutional: She is oriented to person, place, and time. She appears well-developed and well-nourished. No distress.  HENT:  Head: Atraumatic.  Right Ear: External ear normal.  Left Ear: External ear normal.  Nose: Nose normal.  Mouth/Throat: Oropharynx is clear and moist. No oropharyngeal exudate.  Eyes: Conjunctivae are normal. Pupils are equal, round, and reactive to light.  Neck: Normal range of motion. Neck supple.  Cardiovascular: Normal rate and normal heart sounds.   Pulmonary/Chest: Effort normal and breath sounds normal. No respiratory distress.  Musculoskeletal: Normal range of motion.  Neurological: She is alert and oriented to person, place, and time.  Skin: Skin is warm and dry.  Psychiatric: She has a normal mood and affect. Her behavior is normal.  Nursing note and vitals reviewed.     Assessment & Plan:   Problem List Items Addressed This Visit    None    Visit Diagnoses    Vertigo    -  Primary   Suspect vertigo origin. Meclizine and zofran sent for prn use. Epley Maneuver instructions given. Triggers discussed. F/u if worsening or no improvement.        Follow up plan: Return for as scheduled.

## 2017-01-04 ENCOUNTER — Ambulatory Visit: Payer: Self-pay | Admitting: Family Medicine

## 2017-01-06 ENCOUNTER — Ambulatory Visit (INDEPENDENT_AMBULATORY_CARE_PROVIDER_SITE_OTHER): Payer: BLUE CROSS/BLUE SHIELD | Admitting: Family Medicine

## 2017-01-06 ENCOUNTER — Encounter: Payer: Self-pay | Admitting: Family Medicine

## 2017-01-06 VITALS — BP 148/78 | HR 71 | Temp 98.5°F | Wt 171.0 lb

## 2017-01-06 DIAGNOSIS — L918 Other hypertrophic disorders of the skin: Secondary | ICD-10-CM | POA: Diagnosis not present

## 2017-01-06 NOTE — Progress Notes (Signed)
   BP (!) 148/78   Pulse 71   Temp 98.5 F (36.9 C)   Wt 171 lb (77.6 kg)   SpO2 98%   BMI 31.28 kg/m    Subjective:    Patient ID: Jade Smith, female    DOB: 06/15/1956, 61 y.o.   MRN: [id number]  HPI: Jade Smith is a 61 y.o. female  Chief Complaint  Patient presents with  . Skin Tags    patient has multiple skin tags around her neck that she'd like to have removed.   Patient presents for removal of multiple small skin tags on both sides of her neck. They have become very irritating to patient and get caught on her jewelry and shirts, sometimes causing them to bleed. Has had a larger one removed through cryotherapy in the past and tolerated it well, agreeable to this method again.   Relevant past medical, surgical, family and social history reviewed and updated as indicated. Interim medical history since our last visit reviewed. Allergies and medications reviewed and updated.  Review of Systems  Constitutional: Negative.   Eyes: Negative.   Respiratory: Negative.   Cardiovascular: Negative.   Gastrointestinal: Negative.   Genitourinary: Negative.   Musculoskeletal: Negative.   Neurological: Negative.   Psychiatric/Behavioral: Negative.     Per HPI unless specifically indicated above     Objective:    BP (!) 148/78   Pulse 71   Temp 98.5 F (36.9 C)   Wt 171 lb (77.6 kg)   SpO2 98%   BMI 31.28 kg/m   Wt Readings from Last 3 Encounters:  01/06/17 171 lb (77.6 kg)  01/01/17 171 lb (77.6 kg)  12/16/16 170 lb (77.1 kg)    Physical Exam  Constitutional: She is oriented to person, place, and time. She appears well-developed and well-nourished.  HENT:  Head: Atraumatic.  Eyes: Conjunctivae are normal. Pupils are equal, round, and reactive to light.  Neck: Normal range of motion. Neck supple.  Cardiovascular: Normal rate.   Pulmonary/Chest: Effort normal. No respiratory distress.  Musculoskeletal: Normal range of motion.  Neurological: She is alert  and oriented to person, place, and time.  Skin: Skin is warm and dry.  Multiple irritated skin tags on both sides of neck  Psychiatric: She has a normal mood and affect. Her behavior is normal.  Nursing note and vitals reviewed.  Procedure: Cryo destruction of inflamed skin tags on neck All patient questions were answered adequately after thorough discussion of procedure. Tags were frozen with cry wand individually. Procedure was well tolerated with no immediate complications. Pt instructed to keep the area clean and dry with mild soap and water, and can apply vaseline or neosporin to areas if blisters or wounds form during healing. Discussed to f/u for repeat tx if tag remains after healing.      Assessment & Plan:   Problem List Items Addressed This Visit    None    Visit Diagnoses    Inflamed skin tag    -  Primary   Tags were treated with cryotherapy in office, tolerated well with no immediate complications. Wound care and expectations discussed.        Follow up plan: Return in about 2 weeks (around 01/20/2017) for if remaining skin tags.

## 2017-01-07 NOTE — Patient Instructions (Signed)
Follow up as needed

## 2017-01-08 ENCOUNTER — Telehealth: Payer: Self-pay | Admitting: Family Medicine

## 2017-01-08 NOTE — Telephone Encounter (Signed)
Ok to shower

## 2017-01-08 NOTE — Telephone Encounter (Signed)
Pt called and would like to know due to the skin tags if she could take a shower instead of taking a bath.

## 2017-01-13 IMAGING — MG MM DIAG BREAST TOMO BILATERAL
8 of 20 series · 8 of 40 positions shown · non-contrast
Comparison: Previous exam(s).

CLINICAL DATA: 59-year-old female with intermittent left breast
pain for 2-3 months. The patient reports 2 prior surgical biopsies
of the upper, outer right breast. In addition, she had a large
birthmark removed from the inframammary fold of the right breast as
a child.

EXAM:
2D DIGITAL DIAGNOSTIC BILATERAL MAMMOGRAM WITH CAD AND ADJUNCT TOMO
ULTRASOUND LEFT BREAST

[R MLO]
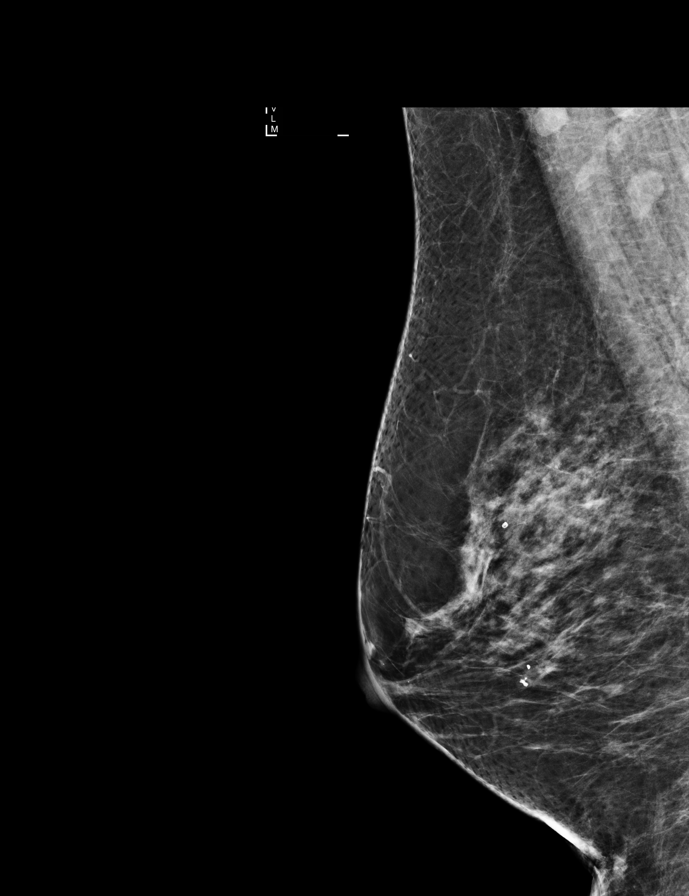

[R CC synth-2D]
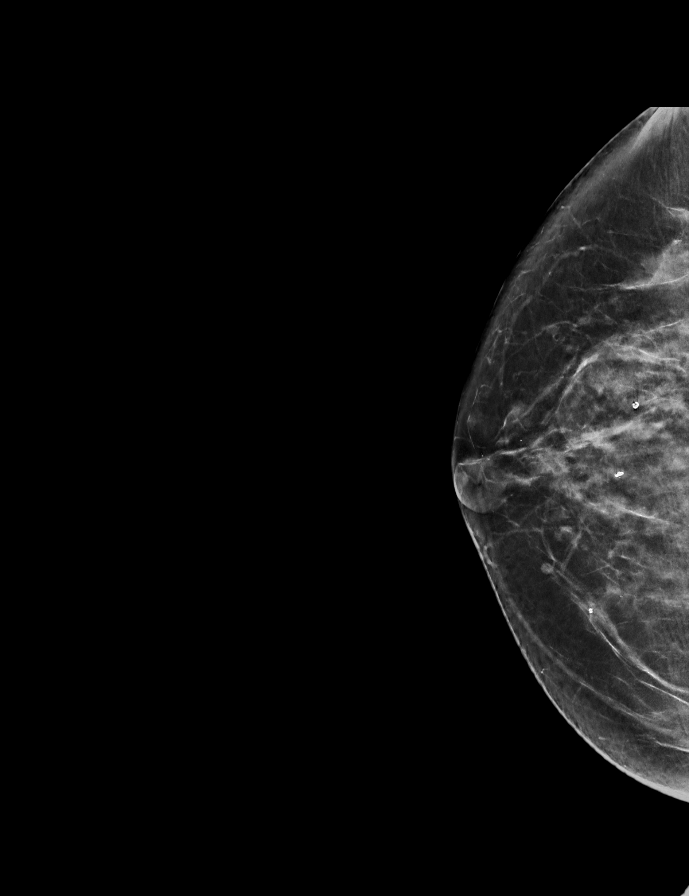

[L CC synth-2D (1 of 3)]
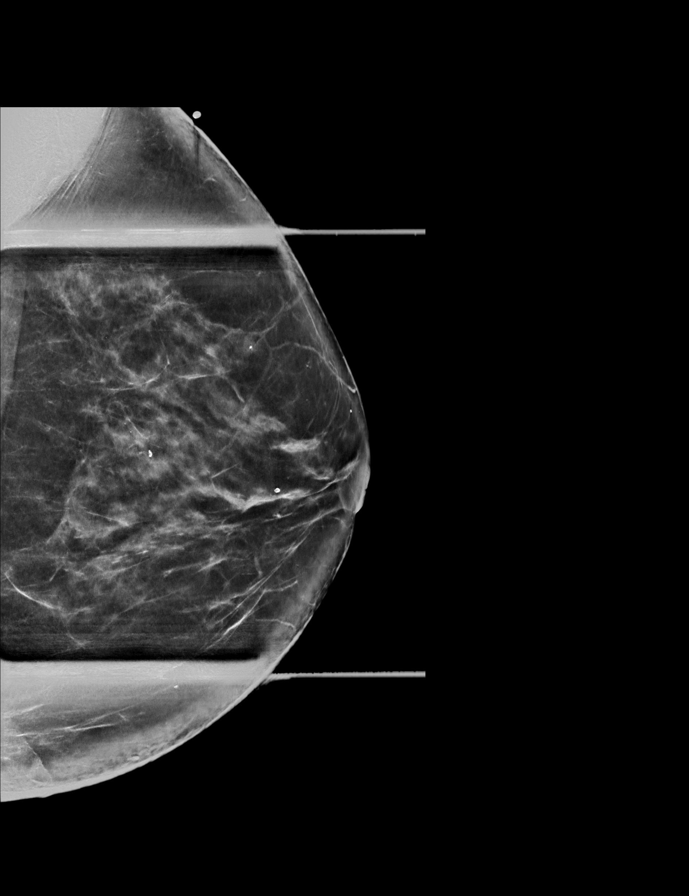

[L CC (1 of 2)]
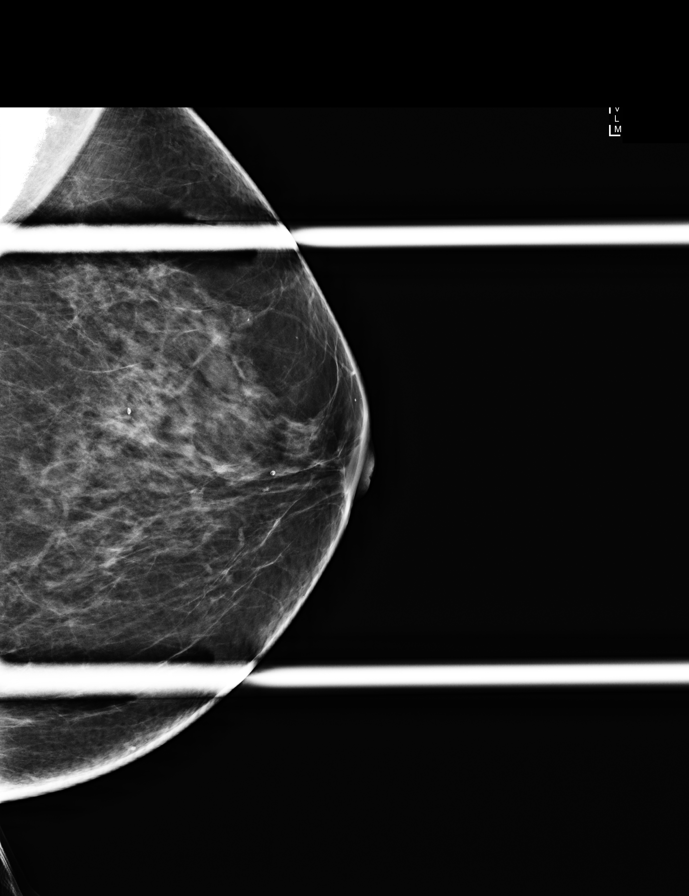

[R CC]
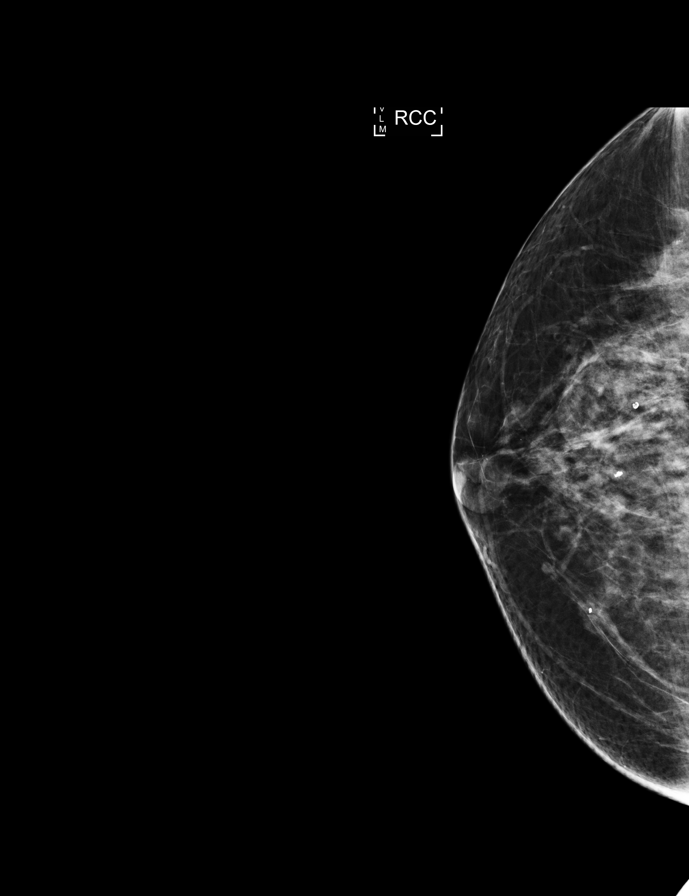

[L CC synth-2D (2 of 3)]
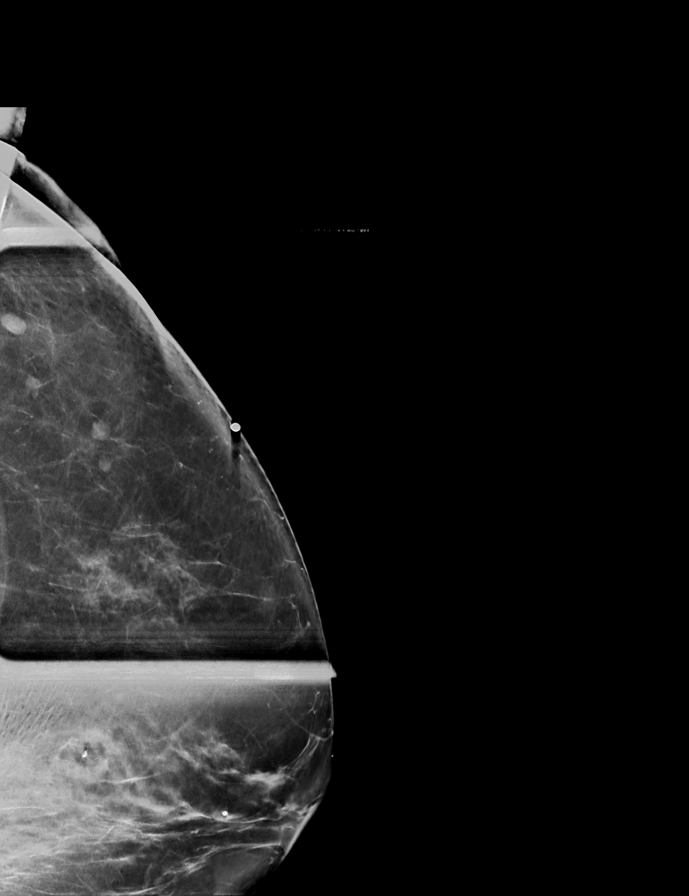

[L CC synth-2D (3 of 3)]
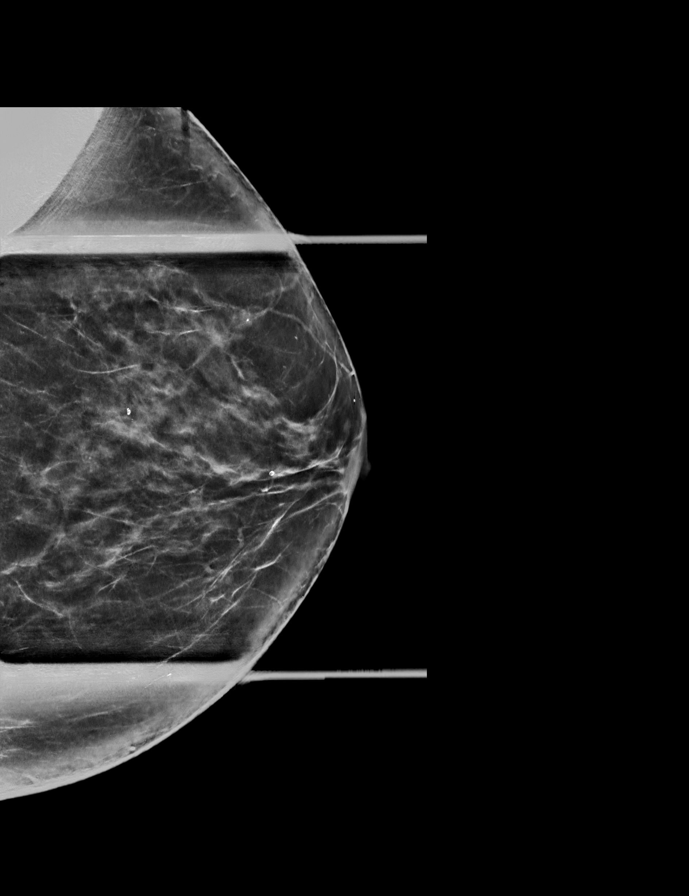

[L CC (2 of 2)]
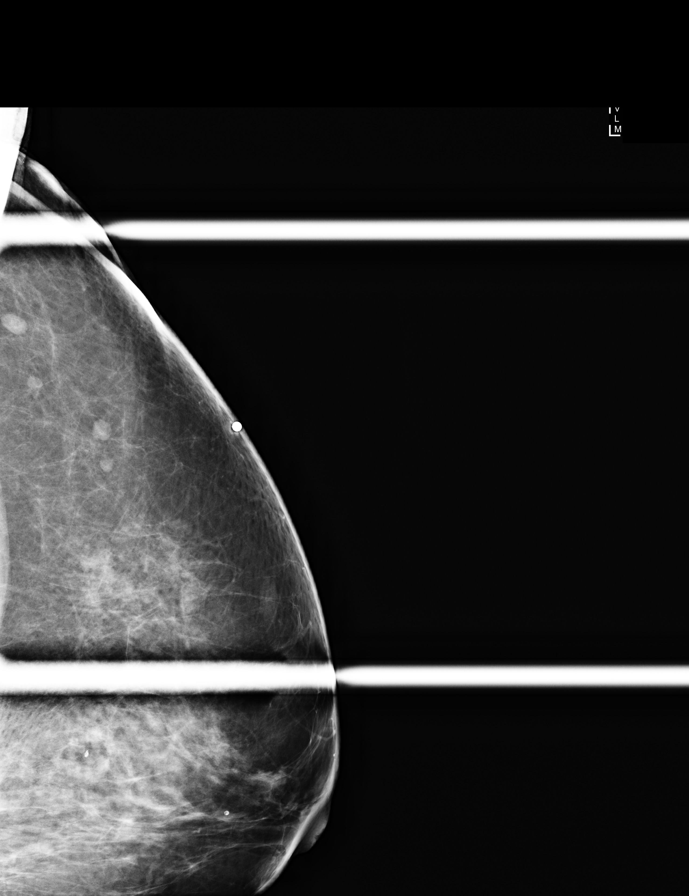

[8 of 40 positions shown; findings below may reference images not displayed]

ACR Breast Density Category c: The breast tissue is heterogeneously
dense, which may obscure small masses.
FINDINGS: No suspicious mass, calcifications, or other abnormality is
identified within the right breast. Within the upper, outer left
breast, middle depth, there is an oval mass with associated punctate
calcification. No additional abnormality is identified.

Mammographic images were processed with CAD.

On physical exam, the patient states that the area of focal pain
within the upper, outer left breast has moved and is now closer to
her nipple. No discrete mass is felt on examination of this area.

Targeted ultrasound is performed, showing an irregular, hypoechoic
mass at 2 o'clock, 3 cm from the nipple measuring 8 x 5 x 4 mm,
corresponding to the oval mass seen mammographically. The patient
reports focal tenderness in this area. No suspicious axillary lymph
nodes are identified.
IMPRESSION: Indeterminate left breast mass.

RECOMMENDATION:
Ultrasound-guided biopsy of the left breast mass at 2 o'clock, 3 cm
from nipple.

I have discussed the findings and recommendations with the patient.
Results were also provided in writing at the conclusion of the
visit. If applicable, a reminder letter will be sent to the patient
regarding the next appointment.

BI-RADS CATEGORY  4: Suspicious.

## 2017-01-20 IMAGING — MG MM DIGITAL DIAGNOSTIC UNILAT L
2 series · 2 of 2 positions shown · non-contrast
Comparison: Previous exam(s).

CLINICAL DATA: Status post ultrasound-guided core needle biopsy of
a small left breast mass

EXAM:
DIAGNOSTIC LEFT MAMMOGRAM POST ULTRASOUND BIOPSY

[L CC]
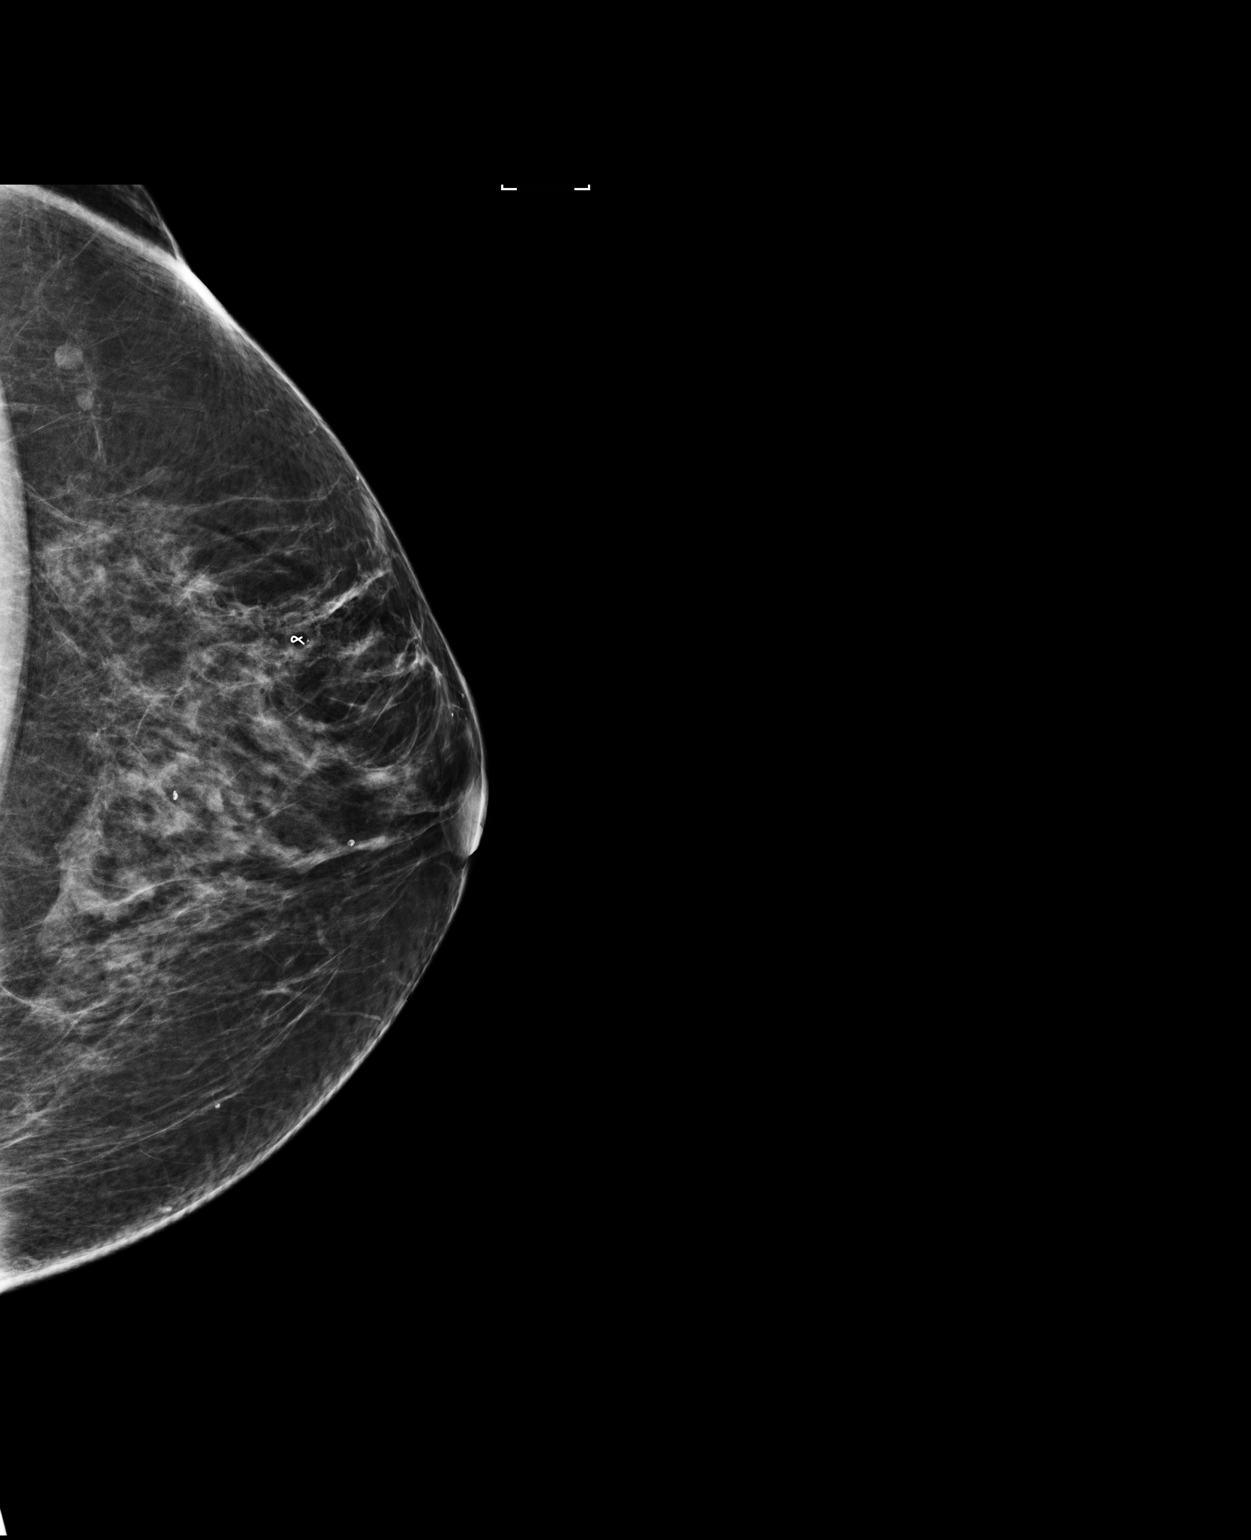

[L ML]
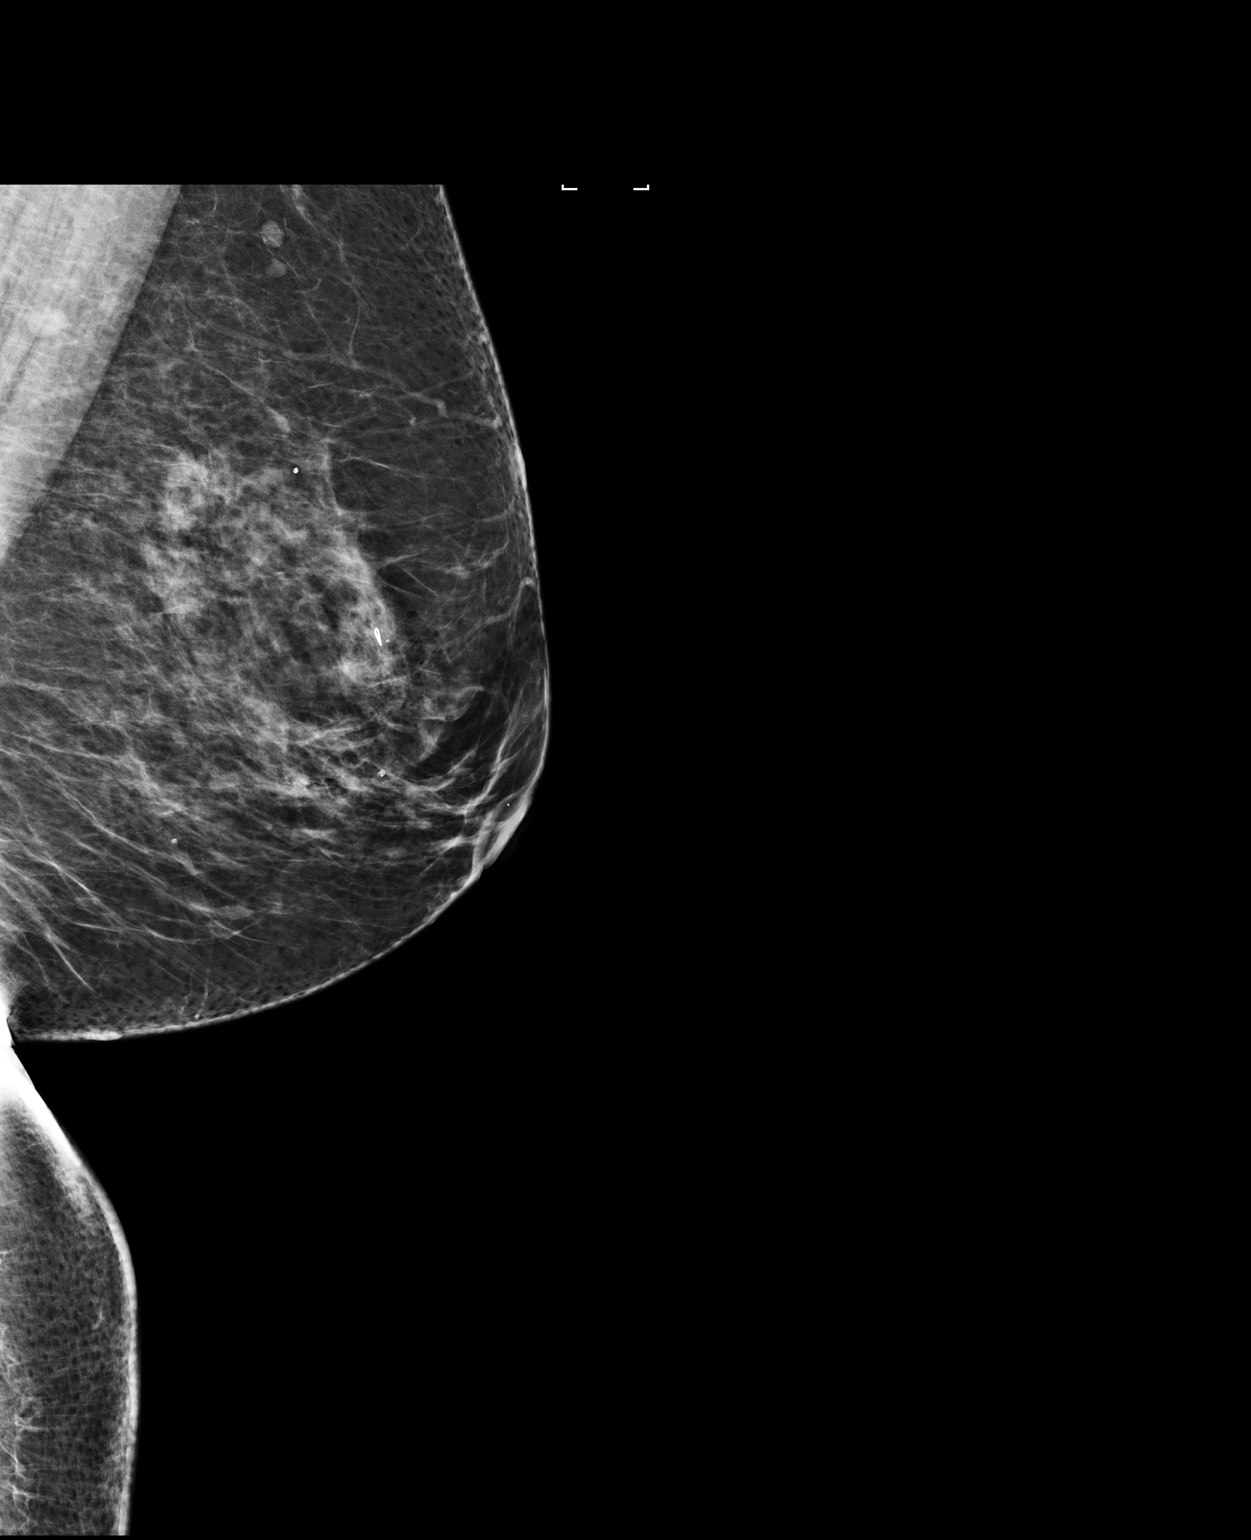

[2 of 2 positions shown; findings below may reference images not displayed]

FINDINGS: Mammographic images were obtained following ultrasound guided biopsy
of a small upper outer quadrant left breast mass. The ribbon shaped
biopsy clip lies within small mass.
IMPRESSION: Well-positioned ribbon shaped biopsy clip following
ultrasound-guided core needle biopsy of a small left breast mass.

Final Assessment: Post Procedure Mammograms for Marker Placement

## 2017-01-26 ENCOUNTER — Telehealth: Payer: Self-pay

## 2017-01-26 NOTE — Telephone Encounter (Signed)
Patient would like to know if her hot flash medication can be increased to twice a day since summer is coming and they get get worse then.   [facility] [address]   Medroxypr ? Estradiol

## 2017-01-27 MED ORDER — MEDROXYPROGESTERONE ACETATE 2.5 MG PO TABS: 2.5000 mg | ORAL_TABLET | Freq: Every day | ORAL | 3 refills | Status: DC

## 2017-01-27 MED ORDER — ESTRADIOL 2 MG PO TABS: 2.0000 mg | ORAL_TABLET | Freq: Every day | ORAL | 3 refills | Status: DC

## 2017-01-27 NOTE — Telephone Encounter (Signed)
Discussed with patient worsening sweats and hot flashes in summertime, states she did very well last summer increasing to 2 mg estradiol tabs and would like to make that change seasonally again. Also out of provera and would like a refill. Is on a pain patch and the patches don't stay on if she doesn't have some help with the intermittent sweats. Medications sent to [facility]. Discussed risks and benefits. Will re-evaluate dosing and discuss taking a HRT holiday after summer.

## 2017-01-31 ENCOUNTER — Other Ambulatory Visit: Payer: Self-pay | Admitting: Family Medicine

## 2017-02-01 ENCOUNTER — Telehealth: Payer: Self-pay | Admitting: Family Medicine

## 2017-02-01 NOTE — Telephone Encounter (Signed)
Pt would like to have Sumatriptan  sent to [facility] [address] for migraines.

## 2017-02-02 MED ORDER — SUMATRIPTAN SUCCINATE 100 MG PO TABS: 100.0000 mg | ORAL_TABLET | ORAL | 12 refills | Status: DC | PRN

## 2017-02-02 NOTE — Telephone Encounter (Signed)
Routing to provider  

## 2017-02-02 NOTE — Telephone Encounter (Signed)
Routing to Dr.Johnson who is patient's PCP.

## 2017-03-05 ENCOUNTER — Other Ambulatory Visit: Payer: Self-pay | Admitting: Family Medicine

## 2017-03-05 NOTE — Telephone Encounter (Signed)
Last (acute) OV: 01/06/17 Last routine OV: 12/16/16 Next OV: None on file.

## 2017-04-12 ENCOUNTER — Telehealth: Payer: Self-pay | Admitting: Family Medicine

## 2017-04-12 NOTE — Telephone Encounter (Signed)
I do see that. Seems like the patient and spouse were unaware of that.  Thanks I will give them a call.

## 2017-04-12 NOTE — Telephone Encounter (Signed)
Patient's husband would like to change patient's provider to Dr. Laural BenesJohnson because that who he sees as well. Informed patient that his wife would have to verbally inform that she would like to change providers.  Please Advise.  Thank you

## 2017-04-12 NOTE — Telephone Encounter (Signed)
Tiffany: It looks like patient's primary is Dr.Johnson, unsure of what I need to do with this

## 2017-04-12 NOTE — Telephone Encounter (Signed)
Called patient and informed her that her PCP is Dr. Laural BenesJohnson and scheduled an appointment.

## 2017-04-15 ENCOUNTER — Encounter: Payer: Self-pay | Admitting: Family Medicine

## 2017-04-15 ENCOUNTER — Ambulatory Visit: Payer: BLUE CROSS/BLUE SHIELD | Admitting: Family Medicine

## 2017-04-15 ENCOUNTER — Ambulatory Visit (INDEPENDENT_AMBULATORY_CARE_PROVIDER_SITE_OTHER): Payer: BLUE CROSS/BLUE SHIELD | Admitting: Family Medicine

## 2017-04-15 VITALS — BP 137/80 | HR 67 | Temp 98.4°F | Wt 165.6 lb

## 2017-04-15 DIAGNOSIS — M5441 Lumbago with sciatica, right side: Secondary | ICD-10-CM | POA: Diagnosis not present

## 2017-04-15 DIAGNOSIS — M5442 Lumbago with sciatica, left side: Secondary | ICD-10-CM | POA: Diagnosis not present

## 2017-04-15 MED ORDER — PREDNISONE 10 MG PO TABS: ORAL_TABLET | ORAL | 0 refills | Status: DC

## 2017-04-15 NOTE — Progress Notes (Signed)
   BP 137/80   Pulse 67   Temp 98.4 F (36.9 C)   Wt 165 lb 9.6 oz (75.1 kg)   SpO2 96%   BMI 30.29 kg/m    Subjective:    Patient ID: Jade KocherRebecca Vinciguerra, female    DOB: 04/15/1956, 61 y.o.   MRN: [id number]  HPI: Jade Smith is a 61 y.o. female  Chief Complaint  Patient presents with  . Back Pain    pt states that she sees a pain specialist who is currently out of town. States she was recently given prednisone for her pain but has ran out and would like a refill.    Patient presents for persistent low back pain with b/l sciatica. Has been having to do prednisone tapers frequently when this flares up and is out of refills with current flare lasting 1 week. Seeing pain management who plans to do back injections in the next few weeks. Wanting some more prednisone to get her through until then. Denies incontinence, fevers, chills, leg weakness.   Relevant past medical, surgical, family and social history reviewed and updated as indicated. Interim medical history since our last visit reviewed. Allergies and medications reviewed and updated.  Review of Systems  Constitutional: Negative.   HENT: Negative.   Respiratory: Negative.   Cardiovascular: Negative.   Gastrointestinal: Negative.   Genitourinary: Negative.   Musculoskeletal: Positive for back pain.  Neurological: Negative.   Psychiatric/Behavioral: Negative.    Per HPI unless specifically indicated above     Objective:    BP 137/80   Pulse 67   Temp 98.4 F (36.9 C)   Wt 165 lb 9.6 oz (75.1 kg)   SpO2 96%   BMI 30.29 kg/m   Wt Readings from Last 3 Encounters:  04/15/17 165 lb 9.6 oz (75.1 kg)  01/06/17 171 lb (77.6 kg)  01/01/17 171 lb (77.6 kg)    Physical Exam  Constitutional: She is oriented to person, place, and time. She appears well-developed and well-nourished.  HENT:  Head: Atraumatic.  Eyes: Conjunctivae are normal. Pupils are equal, round, and reactive to light.  Neck: Normal range of motion.  Neck supple.  Cardiovascular: Normal rate and normal heart sounds.   Pulmonary/Chest: Effort normal and breath sounds normal. No respiratory distress.  Musculoskeletal: Normal range of motion. She exhibits no edema, tenderness or deformity.  - SLR  Neurological: She is alert and oriented to person, place, and time. No cranial nerve deficit. She exhibits normal muscle tone.  Skin: Skin is warm and dry.  Psychiatric: She has a normal mood and affect. Her behavior is normal.  Nursing note and vitals reviewed.     Assessment & Plan:   Problem List Items Addressed This Visit    None    Visit Diagnoses    Acute midline low back pain with bilateral sciatica    -  Primary   Prednisone taper sent, continue current regimen from pain management. F/u for back injections as scheduled   Relevant Medications   predniSONE (DELTASONE) 10 MG tablet       Follow up plan: Return in about 3 months (around 07/16/2017) for Fasting lipid, CMP.

## 2017-05-23 ENCOUNTER — Other Ambulatory Visit: Payer: Self-pay | Admitting: Family Medicine

## 2017-05-24 ENCOUNTER — Ambulatory Visit (INDEPENDENT_AMBULATORY_CARE_PROVIDER_SITE_OTHER): Payer: BLUE CROSS/BLUE SHIELD | Admitting: Family Medicine

## 2017-05-24 ENCOUNTER — Encounter: Payer: Self-pay | Admitting: Family Medicine

## 2017-05-24 VITALS — BP 124/74 | HR 75 | Wt 158.0 lb

## 2017-05-24 DIAGNOSIS — L03012 Cellulitis of left finger: Secondary | ICD-10-CM

## 2017-05-24 DIAGNOSIS — G894 Chronic pain syndrome: Secondary | ICD-10-CM | POA: Diagnosis not present

## 2017-05-24 MED ORDER — PREDNISONE 10 MG PO TABS: ORAL_TABLET | ORAL | 0 refills | Status: DC

## 2017-05-24 MED ORDER — SULFAMETHOXAZOLE-TRIMETHOPRIM 800-160 MG PO TABS: 1.0000 | ORAL_TABLET | Freq: Two times a day (BID) | ORAL | 0 refills | Status: DC

## 2017-05-24 NOTE — Progress Notes (Signed)
BP 124/74   Pulse 75   Wt 158 lb (71.7 kg)   SpO2 98%   BMI 28.90 kg/m    Subjective:    Patient ID: Jade Smith, female    DOB: 12/15/1955, 61 y.o.   MRN: [id number]  HPI: [name] is a 61 y.o. female  Chief Complaint  Patient presents with  . Hand Pain    Left hand, middle finger. x 1 week.    Patient presents with left middle finger pain and swelling at nail edge x 1 week. Started after she cut a hangnail back in the area. Has tried epsom salt soaks with no relief.   Notes significant relief in her chronic pain with prednisone taper during prolonged rainy period the past 2 weeks. Wanting another script to have around as needed for these flares.   Past Medical History:  Diagnosis Date  . Anxiety   . Chronic hip pain (Right) 01/07/2016  . Chronic low back pain (Location of Primary Source of Pain) (Bilateral) (R>L) 01/07/2016  . Chronic lower extremity pain (Location of Secondary source of pain) (Bilateral) (R>L) 01/07/2016   The pain going down both lower extremities appears to be referred from the lower back.   . Chronic sacroiliac joint pain (Right) 01/07/2016  . Fibromyalgia   . Hyperlipidemia   . Migraine   . OAB (overactive bladder)   . OSA (obstructive sleep apnea)   . Sleep apnea    Social History   Social History  . Marital status: Married    Spouse name: N/A  . Number of children: N/A  . Years of education: N/A   Occupational History  . Not on file.   Social History Main Topics  . Smoking status: Former Games developer  . Smokeless tobacco: Never Used  . Alcohol use No  . Drug use: No  . Sexual activity: Not on file   Other Topics Concern  . Not on file   Social History Narrative  . No narrative on file   Relevant past medical, surgical, family and social history reviewed and updated as indicated. Interim medical history since our last visit reviewed. Allergies and medications reviewed and updated.  Review of Systems  Constitutional:  Negative.   HENT: Negative.   Respiratory: Negative.   Cardiovascular: Negative.   Musculoskeletal: Positive for arthralgias and back pain.  Skin:       Paronychia   Neurological: Negative.   Psychiatric/Behavioral: Negative.    Per HPI unless specifically indicated above     Objective:    BP 124/74   Pulse 75   Wt 158 lb (71.7 kg)   SpO2 98%   BMI 28.90 kg/m   Wt Readings from Last 3 Encounters:  05/24/17 158 lb (71.7 kg)  04/15/17 165 lb 9.6 oz (75.1 kg)  01/06/17 171 lb (77.6 kg)    Physical Exam  Constitutional: She is oriented to person, place, and time. She appears well-developed and well-nourished. No distress.  HENT:  Head: Atraumatic.  Eyes: Pupils are equal, round, and reactive to light. Conjunctivae are normal. No scleral icterus.  Neck: Normal range of motion. Neck supple.  Cardiovascular: Normal rate and normal heart sounds.   Pulmonary/Chest: Effort normal and breath sounds normal. No respiratory distress.  Musculoskeletal: Normal range of motion.  Neurological: She is alert and oriented to person, place, and time.  Skin: Skin is warm and dry. There is erythema.  Left middle finger erythematous, edematous, and ttp at nail edge with small abscess  Psychiatric: She has a normal mood and affect. Her behavior is normal.  Nursing note and vitals reviewed.     Assessment & Plan:   Problem List Items Addressed This Visit      Other   Chronic pain (Chronic)    Severe flares with prolonged rainy periods, prednisone providing significant relief during these times. Took taper given several months ago over last week during flare with complete resolution. Another round sent for as needed use in the future. Reviewed risks of frequent prednisone use at length, pt aware and understanding. Will use sparingly in addition to current regimen of tramadol and fentanyl patch      Relevant Medications   predniSONE (DELTASONE) 10 MG tablet    Other Visit Diagnoses     Paronychia of finger, left    -  Primary   Bactrim sent, epsom salt soaks, warm compresses, topical neosporin. F/u if no improvement   Relevant Medications   sulfamethoxazole-trimethoprim (BACTRIM DS,SEPTRA DS) 800-160 MG tablet       Follow up plan: Return for as scheduled.

## 2017-05-24 NOTE — Assessment & Plan Note (Addendum)
Severe flares with prolonged rainy periods, prednisone providing significant relief during these times. Took taper given several months ago over last week during flare with complete resolution. Another round sent for as needed use in the future. Reviewed risks of frequent prednisone use at length, pt aware and understanding. Will use sparingly in addition to current regimen of tramadol and fentanyl patch

## 2017-05-24 NOTE — Telephone Encounter (Signed)
Routing to provider. Next appt on 07/16/17.

## 2017-06-07 ENCOUNTER — Other Ambulatory Visit: Payer: Self-pay | Admitting: Physical Medicine and Rehabilitation

## 2017-06-07 DIAGNOSIS — M47817 Spondylosis without myelopathy or radiculopathy, lumbosacral region: Secondary | ICD-10-CM

## 2017-06-11 ENCOUNTER — Ambulatory Visit: Payer: BLUE CROSS/BLUE SHIELD

## 2017-06-14 ENCOUNTER — Ambulatory Visit: Payer: BLUE CROSS/BLUE SHIELD

## 2017-06-19 ENCOUNTER — Other Ambulatory Visit: Payer: Self-pay | Admitting: Family Medicine

## 2017-06-22 NOTE — Telephone Encounter (Signed)
Routing to provider. Appt on 07/16/17.

## 2017-07-16 ENCOUNTER — Encounter: Payer: Self-pay | Admitting: Family Medicine

## 2017-07-16 ENCOUNTER — Ambulatory Visit (INDEPENDENT_AMBULATORY_CARE_PROVIDER_SITE_OTHER): Payer: BLUE CROSS/BLUE SHIELD | Admitting: Family Medicine

## 2017-07-16 VITALS — BP 118/73 | HR 55 | Temp 98.4°F | Wt 156.0 lb

## 2017-07-16 DIAGNOSIS — F411 Generalized anxiety disorder: Secondary | ICD-10-CM

## 2017-07-16 DIAGNOSIS — E7800 Pure hypercholesterolemia, unspecified: Secondary | ICD-10-CM

## 2017-07-16 MED ORDER — BUSPIRONE HCL 10 MG PO TABS: 10.0000 mg | ORAL_TABLET | Freq: Two times a day (BID) | ORAL | 1 refills | Status: DC

## 2017-07-16 MED ORDER — ATORVASTATIN CALCIUM 10 MG PO TABS: 10.0000 mg | ORAL_TABLET | Freq: Every day | ORAL | 1 refills | Status: DC

## 2017-07-16 MED ORDER — DULOXETINE HCL 60 MG PO CPEP: 60.0000 mg | ORAL_CAPSULE | Freq: Every day | ORAL | 1 refills | Status: DC

## 2017-07-16 NOTE — Assessment & Plan Note (Signed)
Rechecking labs today. Await results. Call with any concerns. Continue current regimen.  

## 2017-07-16 NOTE — Assessment & Plan Note (Signed)
Under good control. Continue current regimen. Call with any concerns. Continue to monitor. Refills given today.

## 2017-07-16 NOTE — Progress Notes (Signed)
BP 118/73 (BP Location: Left Arm, Patient Position: Sitting, Cuff Size: Normal)   Pulse (!) 55   Temp 98.4 F (36.9 C)   Wt 156 lb (70.8 kg)   SpO2 99%   BMI 28.53 kg/m    Subjective:    Patient ID: Jade Smith, female    DOB: 17-Dec-1955, 61 y.o.   MRN: [id number]  HPI: Jade Smith is a 61 y.o. female  Chief Complaint  Patient presents with  . Anxiety  . Hyperlipidemia   ANXIETY/STRESS Duration:controlled Anxious mood: no  Excessive worrying: no Irritability: no  Sweating: no Nausea: no Palpitations:no Hyperventilation: no Panic attacks: no Agoraphobia: no  Obscessions/compulsions: no Depressed mood: no Depression screen [facility] 2/9 05/24/2017 12/16/2016 01/07/2016  Decreased Interest 0 0 3  Down, Depressed, Hopeless 0 0 1  PHQ - 2 Score 0 0 4  Altered sleeping - - 0  Tired, decreased energy - - 1  Change in appetite - - 3  Feeling bad or failure about yourself  - - 1  Trouble concentrating - - 0  Moving slowly or fidgety/restless - - 0  Suicidal thoughts - - 0  PHQ-9 Score - - 9  Difficult doing work/chores - - Very difficult   Anhedonia: no Weight changes: no Insomnia: no   Hypersomnia: no Fatigue/loss of energy: no Feelings of worthlessness: no Feelings of guilt: no Impaired concentration/indecisiveness: no Suicidal ideations: no  Crying spells: no Recent Stressors/Life Changes: no   Relationship problems: no   Family stress: no     Financial stress: no    Job stress: no    Recent death/loss: no  HYPERLIPIDEMIA Hyperlipidemia status: excellent compliance Satisfied with current treatment?  yes Side effects:  no Medication compliance: excellent compliance Past cholesterol meds: atorvastatin Supplements: none Aspirin:  no The 10-year ASCVD risk score Denman George DC Jr., et al., 2013) is: 2.7%   Values used to calculate the score:     Age: 19 years     Sex: Female     Is Non-Hispanic African American: No     Diabetic: No     Tobacco smoker:  No     Systolic Blood Pressure: 118 mmHg     Is BP treated: No     HDL Cholesterol: 41 mg/dL     Total Cholesterol: 146 mg/dL Chest pain:  no Coronary artery disease:  no Family history CAD:  yes  Relevant past medical, surgical, family and social history reviewed and updated as indicated. Interim medical history since our last visit reviewed. Allergies and medications reviewed and updated.  Review of Systems  Constitutional: Negative.   Respiratory: Negative.   Cardiovascular: Negative.   Musculoskeletal: Negative.   Psychiatric/Behavioral: Negative.     Per HPI unless specifically indicated above     Objective:    BP 118/73 (BP Location: Left Arm, Patient Position: Sitting, Cuff Size: Normal)   Pulse (!) 55   Temp 98.4 F (36.9 C)   Wt 156 lb (70.8 kg)   SpO2 99%   BMI 28.53 kg/m   Wt Readings from Last 3 Encounters:  07/16/17 156 lb (70.8 kg)  05/24/17 158 lb (71.7 kg)  04/15/17 165 lb 9.6 oz (75.1 kg)    Physical Exam  Constitutional: She is oriented to person, place, and time. She appears well-developed and well-nourished. No distress.  HENT:  Head: Normocephalic and atraumatic.  Right Ear: Hearing normal.  Left Ear: Hearing normal.  Nose: Nose normal.  Eyes: Conjunctivae and lids are normal. Right  eye exhibits no discharge. Left eye exhibits no discharge. No scleral icterus.  Cardiovascular: Normal rate, regular rhythm, normal heart sounds and intact distal pulses.  Exam reveals no gallop and no friction rub.   No murmur heard. Pulmonary/Chest: Effort normal and breath sounds normal. No respiratory distress. She has no wheezes. She has no rales.  Musculoskeletal: Normal range of motion.  Neurological: She is alert and oriented to person, place, and time.  Skin: Skin is warm, dry and intact. No rash noted. She is not diaphoretic. No erythema. No pallor.  Psychiatric: She has a normal mood and affect. Her speech is normal and behavior is normal. Judgment and  thought content normal. Cognition and memory are normal.  Nursing note and vitals reviewed.   Results for orders placed or performed in visit on 12/16/16  Microscopic Examination  Result Value Ref Range   WBC, UA 0-5 0 - 5 /hpf   RBC, UA None seen 0 - 2 /hpf   Epithelial Cells (non renal) 0-10 0 - 10 /hpf   Mucus, UA Present (A) Not Estab.   Bacteria, UA Few (A) None seen/Few  CBC with Differential/Platelet  Result Value Ref Range   WBC 7.1 3.4 - 10.8 x10E3/uL   RBC 4.60 3.77 - 5.28 x10E6/uL   Hemoglobin 14.4 11.1 - 15.9 g/dL   Hematocrit 16.1 09.6 - 46.6 %   MCV 92 79 - 97 fL   MCH 31.3 26.6 - 33.0 pg   MCHC 34.0 31.5 - 35.7 g/dL   RDW 04.5 40.9 - 81.1 %   Platelets 217 150 - 379 x10E3/uL   Neutrophils 59 Not Estab. %   Lymphs 32 Not Estab. %   Monocytes 7 Not Estab. %   Eos 2 Not Estab. %   Basos 0 Not Estab. %   Neutrophils Absolute 4.2 1.4 - 7.0 x10E3/uL   Lymphocytes Absolute 2.2 0.7 - 3.1 x10E3/uL   Monocytes Absolute 0.5 0.1 - 0.9 x10E3/uL   EOS (ABSOLUTE) 0.2 0.0 - 0.4 x10E3/uL   Basophils Absolute 0.0 0.0 - 0.2 x10E3/uL   Immature Granulocytes 0 Not Estab. %   Immature Grans (Abs) 0.0 0.0 - 0.1 x10E3/uL  Comprehensive metabolic panel  Result Value Ref Range   Glucose 103 (H) 65 - 99 mg/dL   BUN 17 6 - 24 mg/dL   Creatinine, Ser 9.14 0.57 - 1.00 mg/dL   GFR calc non Af Amer 82 >59 mL/min/1.73   GFR calc Af Amer 95 >59 mL/min/1.73   BUN/Creatinine Ratio 22 9 - 23   Sodium 142 134 - 144 mmol/L   Potassium 4.9 3.5 - 5.2 mmol/L   Chloride 104 96 - 106 mmol/L   CO2 23 18 - 29 mmol/L   Calcium 9.0 8.7 - 10.2 mg/dL   Total Protein 6.7 6.0 - 8.5 g/dL   Albumin 4.4 3.5 - 5.5 g/dL   Globulin, Total 2.3 1.5 - 4.5 g/dL   Albumin/Globulin Ratio 1.9 1.2 - 2.2   Bilirubin Total 0.8 0.0 - 1.2 mg/dL   Alkaline Phosphatase 74 39 - 117 IU/L   AST 18 0 - 40 IU/L   ALT 19 0 - 32 IU/L  Lipid Panel w/o Chol/HDL Ratio  Result Value Ref Range   Cholesterol, Total 146 100 -  199 mg/dL   Triglycerides 70 0 - 149 mg/dL   HDL 41 >78 mg/dL   VLDL Cholesterol Cal 14 5 - 40 mg/dL   LDL Calculated 91 0 - 99 mg/dL  TSH  Result Value Ref Range   TSH 1.990 0.450 - 4.500 uIU/mL  UA/M w/rflx Culture, Routine  Result Value Ref Range   Specific Gravity, UA >1.030 (H) 1.005 - 1.030   pH, UA 5.0 5.0 - 7.5   Color, UA Yellow Yellow   Appearance Ur Cloudy (A) Clear   Leukocytes, UA Negative Negative   Protein, UA Trace (A) Negative/Trace   Glucose, UA Negative Negative   Ketones, UA Negative Negative   RBC, UA Negative Negative   Bilirubin, UA Negative Negative   Urobilinogen, Ur 0.2 0.2 - 1.0 mg/dL   Nitrite, UA Negative Negative   Microscopic Examination See below:   Hepatitis C Antibody  Result Value Ref Range   Hep C Virus Ab <0.1 0.0 - 0.9 s/co ratio  IGP, Aptima HPV, rfx 16/18,45  Result Value Ref Range   DIAGNOSIS: Comment    Specimen adequacy: Comment    Clinician Provided ICD10 Comment    Performed by: Comment    PAP Smear Comment .    Note: Comment    Test Methodology Comment    HPV Aptima Negative Negative      Assessment & Plan:   Problem List Items Addressed This Visit      Other   Anxiety, generalized - Primary    Under good control. Continue current regimen. Call with any concerns. Continue to monitor. Refills given today.      Relevant Medications   busPIRone (BUSPAR) 10 MG tablet   DULoxetine (CYMBALTA) 60 MG capsule   Pure hypercholesterolemia    Rechecking labs today. Await results. Call with any concerns. Continue current regimen.       Relevant Medications   atorvastatin (LIPITOR) 10 MG tablet   Other Relevant Orders   Comprehensive metabolic panel   Lipid Panel w/o Chol/HDL Ratio       Follow up plan: Return in about 6 months (around 01/13/2018) for Physical.

## 2017-07-17 LAB — COMPREHENSIVE METABOLIC PANEL
A/G RATIO: 1.9 (ref 1.2–2.2)
ALT: 12 IU/L (ref 0–32)
AST: 16 IU/L (ref 0–40)
Albumin: 4.1 g/dL (ref 3.6–4.8)
Alkaline Phosphatase: 61 IU/L (ref 39–117)
BUN/Creatinine Ratio: 33 — ABNORMAL HIGH (ref 12–28)
BUN: 22 mg/dL (ref 8–27)
Bilirubin Total: 1.1 mg/dL (ref 0.0–1.2)
CO2: 23 mmol/L (ref 20–29)
Calcium: 9.4 mg/dL (ref 8.7–10.3)
Chloride: 105 mmol/L (ref 96–106)
Creatinine, Ser: 0.67 mg/dL (ref 0.57–1.00)
GFR calc Af Amer: 110 mL/min/{1.73_m2} (ref >59)
GFR calc non Af Amer: 96 mL/min/{1.73_m2} (ref >59)
GLOBULIN, TOTAL: 2.2 g/dL (ref 1.5–4.5)
Glucose: 93 mg/dL (ref 65–99)
POTASSIUM: 4 mmol/L (ref 3.5–5.2)
SODIUM: 141 mmol/L (ref 134–144)
Total Protein: 6.3 g/dL (ref 6.0–8.5)

## 2017-07-17 LAB — LIPID PANEL W/O CHOL/HDL RATIO
Cholesterol, Total: 146 mg/dL (ref 100–199)
HDL: 33 mg/dL — AB (ref >39)
LDL CALC: 90 mg/dL (ref 0–99)
Triglycerides: 117 mg/dL (ref 0–149)
VLDL CHOLESTEROL CAL: 23 mg/dL (ref 5–40)

## 2017-08-27 ENCOUNTER — Telehealth: Payer: Self-pay

## 2017-08-27 DIAGNOSIS — Z1211 Encounter for screening for malignant neoplasm of colon: Secondary | ICD-10-CM

## 2017-08-27 NOTE — Telephone Encounter (Signed)
Copied from CRM [id number]. Topic: Appointment Scheduling - Scheduling Inquiry for Clinic >> Aug 27, 2017 10:13 AM Viviann SpareWhite, Selina wrote: Reason for CRM: Patient would like to schedule an appt for a colonoscopy on Dec 10th or 11th.

## 2017-09-08 ENCOUNTER — Other Ambulatory Visit: Payer: Self-pay

## 2017-09-08 DIAGNOSIS — Z800 Family history of malignant neoplasm of digestive organs: Secondary | ICD-10-CM

## 2017-09-08 MED ORDER — PEG 3350-KCL-NA BICARB-NACL 420 G PO SOLR: 4000.0000 mL | Freq: Once | ORAL | 0 refills | Status: AC

## 2017-09-08 NOTE — Progress Notes (Signed)
Gastroenterology Pre-Procedure Review  Request Date: 12/11 Requesting Physician: Dr. Tobi BastosAnna  PATIENT REVIEW QUESTIONS: The patient responded to the following health history questions as indicated:    1. Are you having any GI issues? no 2. Do you have a personal history of Polyps? no 3. Do you have a family history of Colon Cancer or Polyps? yes (colon cancer: brother, 2 nephews (deceased)) 4. Diabetes Mellitus? no 5. Joint replacements in the past 12 months?no 6. Major health problems in the past 3 months?no 7. Any artificial heart valves, MVP, or defibrillator?no    MEDICATIONS & ALLERGIES:    Patient reports the following regarding taking any anticoagulation/antiplatelet therapy:   Plavix, Coumadin, Eliquis, Xarelto, Lovenox, Pradaxa, Brilinta, or Effient? no Aspirin? no  Patient confirms/reports the following medications:  Current Outpatient Medications  Medication Sig Dispense Refill  . albuterol (PROVENTIL HFA;VENTOLIN HFA) 108 (90 Base) MCG/ACT inhaler Inhale 2 puffs into the lungs every 6 (six) hours as needed for wheezing or shortness of breath. (Patient not taking: Reported on 07/16/2017) 1 Inhaler 0  . atorvastatin (LIPITOR) 10 MG tablet Take 1 tablet (10 mg total) by mouth daily. 90 tablet 1  . busPIRone (BUSPAR) 10 MG tablet Take 1 tablet (10 mg total) by mouth 2 (two) times daily. 180 tablet 1  . DULoxetine (CYMBALTA) 60 MG capsule Take 1 capsule (60 mg total) by mouth daily. 90 capsule 1  . estradiol (ESTRACE) 0.1 MG/GM vaginal cream Place 1 Applicatorful vaginally at bedtime.    Marland Kitchen. estradiol (ESTRACE) 2 MG tablet TAKE 1 TABLET BY MOUTH ONCE DAILY 30 tablet 3  . fentaNYL (DURAGESIC - DOSED MCG/HR) 50 MCG/HR Place 50 mcg onto the skin.    Marland Kitchen. ibuprofen (ADVIL,MOTRIN) 200 MG tablet Take 400 mg by mouth every 6 (six) hours as needed.    . medroxyPROGESTERone (PROVERA) 2.5 MG tablet Take 1 tablet (2.5 mg total) by mouth daily. 30 tablet 3  . oxybutynin (DITROPAN-XL) 10 MG 24 hr  tablet TAKE 1 TABLET BY MOUTH ONCE DAILY 90 tablet 1  . polyethylene glycol-electrolytes (NULYTELY/GOLYTELY) 420 g solution Take 4,000 mLs by mouth once for 1 dose. 4000 mL 0  . SUMAtriptan (IMITREX) 100 MG tablet Take 1 tablet (100 mg total) by mouth as needed for migraine. May repeat in 2 hours if headache persists or recurs. 10 tablet 12  . traMADol (ULTRAM) 50 MG tablet Take 50 mg by mouth as needed.      No current facility-administered medications for this visit.     Patient confirms/reports the following allergies:  No Known Allergies  No orders of the defined types were placed in this encounter.   AUTHORIZATION INFORMATION Primary Insurance: 1D#: Group #:  Secondary Insurance: 1D#: Group #:  SCHEDULE INFORMATION: Date: 12/11 Time: Location: [facility]

## 2017-09-27 ENCOUNTER — Other Ambulatory Visit: Payer: Self-pay

## 2017-09-27 DIAGNOSIS — Z800 Family history of malignant neoplasm of digestive organs: Secondary | ICD-10-CM

## 2017-09-27 NOTE — Progress Notes (Signed)
Advised patient of inclement weather procedure change.   Pt has agreed to change to 12/18

## 2017-09-28 ENCOUNTER — Ambulatory Visit
Admission: RE | Admit: 2017-09-28 | Payer: BLUE CROSS/BLUE SHIELD | Source: Ambulatory Visit | Admitting: Gastroenterology

## 2017-09-28 ENCOUNTER — Encounter: Admission: RE | Payer: Self-pay | Source: Ambulatory Visit

## 2017-09-28 SURGERY — COLONOSCOPY WITH PROPOFOL
Anesthesia: General

## 2017-10-04 ENCOUNTER — Telehealth: Payer: Self-pay | Admitting: Gastroenterology

## 2017-10-04 NOTE — Telephone Encounter (Signed)
LVM to cancel procedure for tomorrow due to having the flu.

## 2017-10-05 SURGERY — COLONOSCOPY WITH PROPOFOL
Anesthesia: General

## 2017-10-16 ENCOUNTER — Other Ambulatory Visit: Payer: Self-pay | Admitting: Family Medicine

## 2017-11-09 ENCOUNTER — Ambulatory Visit: Payer: Self-pay | Admitting: *Deleted

## 2017-11-09 NOTE — Telephone Encounter (Signed)
  Reason for Disposition . Small puncture wound (e.g., from gerbil, mouse, hamster, puppy)  Answer Assessment - Initial Assessment Questions 1. ANIMAL: "What type of animal caused the bite?" "Is the injury from a bite or a claw?" If the animal is a dog or a cat, ask: "Was it a pet or a stray?" "Was it acting ill or behaving strangely?"     Dog bite, her own dog. She put her hand in the middle of a scuffle. 2. LOCATION: "Where is the bite located?"    Top of thumb 3. SIZE: "How big is the bite?" "What does it look like?"      Tip pf nail in length, a tooth puncture, red only in the would and  slightly swollen 4. ONSET: "When did the bite happen?" (Minutes or hours ago)      2 days ago 5. CIRCUMSTANCES: "Tell me how this happened."     Tried to stop scuffle between her dog who are up to date on vaccines 6. TETANUS: "When was the last tetanus booster?"     Within last few years. 7. PREGNANCY: "Is there any chance you are pregnant?" "When was your last menstrual period?" no  Protocols used: ANIMAL BITE-A-AH

## 2017-11-29 ENCOUNTER — Other Ambulatory Visit: Payer: Self-pay | Admitting: Family Medicine

## 2018-01-13 ENCOUNTER — Encounter: Payer: Self-pay | Admitting: Family Medicine

## 2018-01-13 ENCOUNTER — Ambulatory Visit (INDEPENDENT_AMBULATORY_CARE_PROVIDER_SITE_OTHER): Payer: BLUE CROSS/BLUE SHIELD | Admitting: Family Medicine

## 2018-01-13 VITALS — BP 133/83 | HR 72 | Ht 61.3 in | Wt 165.6 lb

## 2018-01-13 DIAGNOSIS — M797 Fibromyalgia: Secondary | ICD-10-CM | POA: Diagnosis not present

## 2018-01-13 DIAGNOSIS — E7800 Pure hypercholesterolemia, unspecified: Secondary | ICD-10-CM | POA: Diagnosis not present

## 2018-01-13 DIAGNOSIS — Z0001 Encounter for general adult medical examination with abnormal findings: Secondary | ICD-10-CM | POA: Diagnosis not present

## 2018-01-13 DIAGNOSIS — Z1239 Encounter for other screening for malignant neoplasm of breast: Secondary | ICD-10-CM

## 2018-01-13 DIAGNOSIS — F411 Generalized anxiety disorder: Secondary | ICD-10-CM

## 2018-01-13 DIAGNOSIS — G43109 Migraine with aura, not intractable, without status migrainosus: Secondary | ICD-10-CM | POA: Diagnosis not present

## 2018-01-13 DIAGNOSIS — G894 Chronic pain syndrome: Secondary | ICD-10-CM | POA: Diagnosis not present

## 2018-01-13 DIAGNOSIS — M65351 Trigger finger, right little finger: Secondary | ICD-10-CM

## 2018-01-13 DIAGNOSIS — Z0000 Encounter for general adult medical examination without abnormal findings: Secondary | ICD-10-CM

## 2018-01-13 LAB — UA/M W/RFLX CULTURE, ROUTINE
BILIRUBIN UA: NEGATIVE
Glucose, UA: NEGATIVE
KETONES UA: NEGATIVE
Leukocytes, UA: NEGATIVE
NITRITE UA: NEGATIVE
RBC UA: NEGATIVE
SPEC GRAV UA: 1.02 (ref 1.005–1.030)
UUROB: 0.2 mg/dL (ref 0.2–1.0)
pH, UA: 6.5 (ref 5.0–7.5)

## 2018-01-13 MED ORDER — DULOXETINE HCL 60 MG PO CPEP: 60.0000 mg | ORAL_CAPSULE | Freq: Every day | ORAL | 1 refills | Status: DC

## 2018-01-13 MED ORDER — ESTRADIOL 2 MG PO TABS
2.0000 mg | ORAL_TABLET | Freq: Every day | ORAL | 3 refills | Status: DC
Start: 2018-01-13 — End: 2019-03-09

## 2018-01-13 MED ORDER — BUSPIRONE HCL 10 MG PO TABS: 10.0000 mg | ORAL_TABLET | Freq: Two times a day (BID) | ORAL | 1 refills | Status: DC

## 2018-01-13 MED ORDER — MEDROXYPROGESTERONE ACETATE 2.5 MG PO TABS
2.5000 mg | ORAL_TABLET | Freq: Every day | ORAL | 3 refills | Status: DC
Start: 2018-01-13 — End: 2019-03-09

## 2018-01-13 MED ORDER — OXYBUTYNIN CHLORIDE ER 10 MG PO TB24: 10.0000 mg | ORAL_TABLET | Freq: Every day | ORAL | 1 refills | Status: DC

## 2018-01-13 MED ORDER — SUMATRIPTAN SUCCINATE 100 MG PO TABS: 100.0000 mg | ORAL_TABLET | ORAL | 12 refills | Status: DC | PRN

## 2018-01-13 MED ORDER — ALBUTEROL SULFATE HFA 108 (90 BASE) MCG/ACT IN AERS
2.0000 | INHALATION_SPRAY | Freq: Four times a day (QID) | RESPIRATORY_TRACT | 0 refills | Status: DC | PRN
Start: 2018-01-13 — End: 2018-09-07

## 2018-01-13 MED ORDER — ATORVASTATIN CALCIUM 10 MG PO TABS: 10.0000 mg | ORAL_TABLET | Freq: Every day | ORAL | 1 refills | Status: DC

## 2018-01-13 NOTE — Assessment & Plan Note (Signed)
Under good control. Continue current regimen. Continue to monitor. Refills given today. 

## 2018-01-13 NOTE — Assessment & Plan Note (Signed)
Stable. Continue current regimen. Continue to monitor.  

## 2018-01-13 NOTE — Assessment & Plan Note (Signed)
Stable. Continue to follow with pain management. Call with any concerns.  ?

## 2018-01-13 NOTE — Patient Instructions (Signed)
Health Maintenance for Postmenopausal Women Menopause is a normal process in which your reproductive ability comes to an end. This process happens gradually over a span of months to years, usually between the ages of 22 and 9. Menopause is complete when you have missed 12 consecutive menstrual periods. It is important to talk with your health care provider about some of the most common conditions that affect postmenopausal women, such as heart disease, cancer, and bone loss (osteoporosis). Adopting a healthy lifestyle and getting preventive care can help to promote your health and wellness. Those actions can also lower your chances of developing some of these common conditions. What should I know about menopause? During menopause, you may experience a number of symptoms, such as:  Moderate-to-severe hot flashes.  Night sweats.  Decrease in sex drive.  Mood swings.  Headaches.  Tiredness.  Irritability.  Memory problems.  Insomnia.  Choosing to treat or not to treat menopausal changes is an individual decision that you make with your health care provider. What should I know about hormone replacement therapy and supplements? Hormone therapy products are effective for treating symptoms that are associated with menopause, such as hot flashes and night sweats. Hormone replacement carries certain risks, especially as you become older. If you are thinking about using estrogen or estrogen with progestin treatments, discuss the benefits and risks with your health care provider. What should I know about heart disease and stroke? Heart disease, heart attack, and stroke become more likely as you age. This may be due, in part, to the hormonal changes that your body experiences during menopause. These can affect how your body processes dietary fats, triglycerides, and cholesterol. Heart attack and stroke are both medical emergencies. There are many things that you can do to help prevent heart disease  and stroke:  Have your blood pressure checked at least every 1-2 years. High blood pressure causes heart disease and increases the risk of stroke.  If you are 53-22 years old, ask your health care provider if you should take aspirin to prevent a heart attack or a stroke.  Do not use any tobacco products, including cigarettes, chewing tobacco, or electronic cigarettes. If you need help quitting, ask your health care provider.  It is important to eat a healthy diet and maintain a healthy weight. ? Be sure to include plenty of vegetables, fruits, low-fat dairy products, and lean protein. ? Avoid eating foods that are high in solid fats, added sugars, or salt (sodium).  Get regular exercise. This is one of the most important things that you can do for your health. ? Try to exercise for at least 150 minutes each week. The type of exercise that you do should increase your heart rate and make you sweat. This is known as moderate-intensity exercise. ? Try to do strengthening exercises at least twice each week. Do these in addition to the moderate-intensity exercise.  Know your numbers.Ask your health care provider to check your cholesterol and your blood glucose. Continue to have your blood tested as directed by your health care provider.  What should I know about cancer screening? There are several types of cancer. Take the following steps to reduce your risk and to catch any cancer development as early as possible. Breast Cancer  Practice breast self-awareness. ? This means understanding how your breasts normally appear and feel. ? It also means doing regular breast self-exams. Let your health care provider know about any changes, no matter how small.  If you are 40  or older, have a clinician do a breast exam (clinical breast exam or CBE) every year. Depending on your age, family history, and medical history, it may be recommended that you also have a yearly breast X-ray (mammogram).  If you  have a family history of breast cancer, talk with your health care provider about genetic screening.  If you are at high risk for breast cancer, talk with your health care provider about having an MRI and a mammogram every year.  Breast cancer (BRCA) gene test is recommended for women who have family members with BRCA-related cancers. Results of the assessment will determine the need for genetic counseling and BRCA1 and for BRCA2 testing. BRCA-related cancers include these types: ? Breast. This occurs in males or females. ? Ovarian. ? Tubal. This may also be called fallopian tube cancer. ? Cancer of the abdominal or pelvic lining (peritoneal cancer). ? Prostate. ? Pancreatic.  Cervical, Uterine, and Ovarian Cancer Your health care provider may recommend that you be screened regularly for cancer of the pelvic organs. These include your ovaries, uterus, and vagina. This screening involves a pelvic exam, which includes checking for microscopic changes to the surface of your cervix (Pap test).  For women ages 21-65, health care providers may recommend a pelvic exam and a Pap test every three years. For women ages 79-65, they may recommend the Pap test and pelvic exam, combined with testing for human papilloma virus (HPV), every five years. Some types of HPV increase your risk of cervical cancer. Testing for HPV may also be done on women of any age who have unclear Pap test results.  Other health care providers may not recommend any screening for nonpregnant women who are considered low risk for pelvic cancer and have no symptoms. Ask your health care provider if a screening pelvic exam is right for you.  If you have had past treatment for cervical cancer or a condition that could lead to cancer, you need Pap tests and screening for cancer for at least 20 years after your treatment. If Pap tests have been discontinued for you, your risk factors (such as having a new sexual partner) need to be  reassessed to determine if you should start having screenings again. Some women have medical problems that increase the chance of getting cervical cancer. In these cases, your health care provider may recommend that you have screening and Pap tests more often.  If you have a family history of uterine cancer or ovarian cancer, talk with your health care provider about genetic screening.  If you have vaginal bleeding after reaching menopause, tell your health care provider.  There are currently no reliable tests available to screen for ovarian cancer.  Lung Cancer Lung cancer screening is recommended for adults 69-62 years old who are at high risk for lung cancer because of a history of smoking. A yearly low-dose CT scan of the lungs is recommended if you:  Currently smoke.  Have a history of at least 30 pack-years of smoking and you currently smoke or have quit within the past 15 years. A pack-year is smoking an average of one pack of cigarettes per day for one year.  Yearly screening should:  Continue until it has been 15 years since you quit.  Stop if you develop a health problem that would prevent you from having lung cancer treatment.  Colorectal Cancer  This type of cancer can be detected and can often be prevented.  Routine colorectal cancer screening usually begins at  age 42 and continues through age 45.  If you have risk factors for colon cancer, your health care provider may recommend that you be screened at an earlier age.  If you have a family history of colorectal cancer, talk with your health care provider about genetic screening.  Your health care provider may also recommend using home test kits to check for hidden blood in your stool.  A small camera at the end of a tube can be used to examine your colon directly (sigmoidoscopy or colonoscopy). This is done to check for the earliest forms of colorectal cancer.  Direct examination of the colon should be repeated every  5-10 years until age 71. However, if early forms of precancerous polyps or small growths are found or if you have a family history or genetic risk for colorectal cancer, you may need to be screened more often.  Skin Cancer  Check your skin from head to toe regularly.  Monitor any moles. Be sure to tell your health care provider: ? About any new moles or changes in moles, especially if there is a change in a mole's shape or color. ? If you have a mole that is larger than the size of a pencil eraser.  If any of your family members has a history of skin cancer, especially at a young age, talk with your health care provider about genetic screening.  Always use sunscreen. Apply sunscreen liberally and repeatedly throughout the day.  Whenever you are outside, protect yourself by wearing long sleeves, pants, a wide-brimmed hat, and sunglasses.  What should I know about osteoporosis? Osteoporosis is a condition in which bone destruction happens more quickly than new bone creation. After menopause, you may be at an increased risk for osteoporosis. To help prevent osteoporosis or the bone fractures that can happen because of osteoporosis, the following is recommended:  If you are 46-71 years old, get at least 1,000 mg of calcium and at least 600 mg of vitamin D per day.  If you are older than age 55 but younger than age 65, get at least 1,200 mg of calcium and at least 600 mg of vitamin D per day.  If you are older than age 54, get at least 1,200 mg of calcium and at least 800 mg of vitamin D per day.  Smoking and excessive alcohol intake increase the risk of osteoporosis. Eat foods that are rich in calcium and vitamin D, and do weight-bearing exercises several times each week as directed by your health care provider. What should I know about how menopause affects my mental health? Depression may occur at any age, but it is more common as you become older. Common symptoms of depression  include:  Low or sad mood.  Changes in sleep patterns.  Changes in appetite or eating patterns.  Feeling an overall lack of motivation or enjoyment of activities that you previously enjoyed.  Frequent crying spells.  Talk with your health care provider if you think that you are experiencing depression. What should I know about immunizations? It is important that you get and maintain your immunizations. These include:  Tetanus, diphtheria, and pertussis (Tdap) booster vaccine.  Influenza every year before the flu season begins.  Pneumonia vaccine.  Shingles vaccine.  Your health care provider may also recommend other immunizations. This information is not intended to replace advice given to you by your health care provider. Make sure you discuss any questions you have with your health care provider. Document Released: 11/27/2005  Document Revised: 04/24/2016 Document Reviewed: 07/09/2015 [employer] Interactive Patient Education  2018 [employer]  Health Maintenance for Postmenopausal Women Menopause is a normal process in which your reproductive ability comes to an end. This process happens gradually over a span of months to years, usually between the ages of 48 and 55. Menopause is complete when you have missed 12 consecutive menstrual periods. It is important to talk with your health care provider about some of the most common conditions that affect postmenopausal women, such as heart disease, cancer, and bone loss (osteoporosis). Adopting a healthy lifestyle and getting preventive care can help to promote your health and wellness. Those actions can also lower your chances of developing some of these common conditions. What should I know about menopause? During menopause, you may experience a number of symptoms, such as:  Moderate-to-severe hot flashes.  Night sweats.  Decrease in sex drive.  Mood swings.  Headaches.  Tiredness.  Irritability.  Memory  problems.  Insomnia.  Choosing to treat or not to treat menopausal changes is an individual decision that you make with your health care provider. What should I know about hormone replacement therapy and supplements? Hormone therapy products are effective for treating symptoms that are associated with menopause, such as hot flashes and night sweats. Hormone replacement carries certain risks, especially as you become older. If you are thinking about using estrogen or estrogen with progestin treatments, discuss the benefits and risks with your health care provider. What should I know about heart disease and stroke? Heart disease, heart attack, and stroke become more likely as you age. This may be due, in part, to the hormonal changes that your body experiences during menopause. These can affect how your body processes dietary fats, triglycerides, and cholesterol. Heart attack and stroke are both medical emergencies. There are many things that you can do to help prevent heart disease and stroke:  Have your blood pressure checked at least every 1-2 years. High blood pressure causes heart disease and increases the risk of stroke.  If you are 55-79 years old, ask your health care provider if you should take aspirin to prevent a heart attack or a stroke.  Do not use any tobacco products, including cigarettes, chewing tobacco, or electronic cigarettes. If you need help quitting, ask your health care provider.  It is important to eat a healthy diet and maintain a healthy weight. ? Be sure to include plenty of vegetables, fruits, low-fat dairy products, and lean protein. ? Avoid eating foods that are high in solid fats, added sugars, or salt (sodium).  Get regular exercise. This is one of the most important things that you can do for your health. ? Try to exercise for at least 150 minutes each week. The type of exercise that you do should increase your heart rate and make you sweat. This is known as  moderate-intensity exercise. ? Try to do strengthening exercises at least twice each week. Do these in addition to the moderate-intensity exercise.  Know your numbers.Ask your health care provider to check your cholesterol and your blood glucose. Continue to have your blood tested as directed by your health care provider.  What should I know about cancer screening? There are several types of cancer. Take the following steps to reduce your risk and to catch any cancer development as early as possible. Breast Cancer  Practice breast self-awareness. ? This means understanding how your breasts normally appear and feel. ? It also means doing regular breast self-exams. Let your   health care provider know about any changes, no matter how small.  If you are 59 or older, have a clinician do a breast exam (clinical breast exam or CBE) every year. Depending on your age, family history, and medical history, it may be recommended that you also have a yearly breast X-ray (mammogram).  If you have a family history of breast cancer, talk with your health care provider about genetic screening.  If you are at high risk for breast cancer, talk with your health care provider about having an MRI and a mammogram every year.  Breast cancer (BRCA) gene test is recommended for women who have family members with BRCA-related cancers. Results of the assessment will determine the need for genetic counseling and BRCA1 and for BRCA2 testing. BRCA-related cancers include these types: ? Breast. This occurs in males or females. ? Ovarian. ? Tubal. This may also be called fallopian tube cancer. ? Cancer of the abdominal or pelvic lining (peritoneal cancer). ? Prostate. ? Pancreatic.  Cervical, Uterine, and Ovarian Cancer Your health care provider may recommend that you be screened regularly for cancer of the pelvic organs. These include your ovaries, uterus, and vagina. This screening involves a pelvic exam, which  includes checking for microscopic changes to the surface of your cervix (Pap test).  For women ages 21-65, health care providers may recommend a pelvic exam and a Pap test every three years. For women ages 39-65, they may recommend the Pap test and pelvic exam, combined with testing for human papilloma virus (HPV), every five years. Some types of HPV increase your risk of cervical cancer. Testing for HPV may also be done on women of any age who have unclear Pap test results.  Other health care providers may not recommend any screening for nonpregnant women who are considered low risk for pelvic cancer and have no symptoms. Ask your health care provider if a screening pelvic exam is right for you.  If you have had past treatment for cervical cancer or a condition that could lead to cancer, you need Pap tests and screening for cancer for at least 20 years after your treatment. If Pap tests have been discontinued for you, your risk factors (such as having a new sexual partner) need to be reassessed to determine if you should start having screenings again. Some women have medical problems that increase the chance of getting cervical cancer. In these cases, your health care provider may recommend that you have screening and Pap tests more often.  If you have a family history of uterine cancer or ovarian cancer, talk with your health care provider about genetic screening.  If you have vaginal bleeding after reaching menopause, tell your health care provider.  There are currently no reliable tests available to screen for ovarian cancer.  Lung Cancer Lung cancer screening is recommended for adults 67-72 years old who are at high risk for lung cancer because of a history of smoking. A yearly low-dose CT scan of the lungs is recommended if you:  Currently smoke.  Have a history of at least 30 pack-years of smoking and you currently smoke or have quit within the past 15 years. A pack-year is smoking an  average of one pack of cigarettes per day for one year.  Yearly screening should:  Continue until it has been 15 years since you quit.  Stop if you develop a health problem that would prevent you from having lung cancer treatment.  Colorectal Cancer  This type of cancer  can be detected and can often be prevented.  Routine colorectal cancer screening usually begins at age 93 and continues through age 26.  If you have risk factors for colon cancer, your health care provider may recommend that you be screened at an earlier age.  If you have a family history of colorectal cancer, talk with your health care provider about genetic screening.  Your health care provider may also recommend using home test kits to check for hidden blood in your stool.  A small camera at the end of a tube can be used to examine your colon directly (sigmoidoscopy or colonoscopy). This is done to check for the earliest forms of colorectal cancer.  Direct examination of the colon should be repeated every 5-10 years until age 38. However, if early forms of precancerous polyps or small growths are found or if you have a family history or genetic risk for colorectal cancer, you may need to be screened more often.  Skin Cancer  Check your skin from head to toe regularly.  Monitor any moles. Be sure to tell your health care provider: ? About any new moles or changes in moles, especially if there is a change in a mole's shape or color. ? If you have a mole that is larger than the size of a pencil eraser.  If any of your family members has a history of skin cancer, especially at a young age, talk with your health care provider about genetic screening.  Always use sunscreen. Apply sunscreen liberally and repeatedly throughout the day.  Whenever you are outside, protect yourself by wearing long sleeves, pants, a wide-brimmed hat, and sunglasses.  What should I know about osteoporosis? Osteoporosis is a condition in  which bone destruction happens more quickly than new bone creation. After menopause, you may be at an increased risk for osteoporosis. To help prevent osteoporosis or the bone fractures that can happen because of osteoporosis, the following is recommended:  If you are 11-31 years old, get at least 1,000 mg of calcium and at least 600 mg of vitamin D per day.  If you are older than age 66 but younger than age 45, get at least 1,200 mg of calcium and at least 600 mg of vitamin D per day.  If you are older than age 10, get at least 1,200 mg of calcium and at least 800 mg of vitamin D per day.  Smoking and excessive alcohol intake increase the risk of osteoporosis. Eat foods that are rich in calcium and vitamin D, and do weight-bearing exercises several times each week as directed by your health care provider. What should I know about how menopause affects my mental health? Depression may occur at any age, but it is more common as you become older. Common symptoms of depression include:  Low or sad mood.  Changes in sleep patterns.  Changes in appetite or eating patterns.  Feeling an overall lack of motivation or enjoyment of activities that you previously enjoyed.  Frequent crying spells.  Talk with your health care provider if you think that you are experiencing depression. What should I know about immunizations? It is important that you get and maintain your immunizations. These include:  Tetanus, diphtheria, and pertussis (Tdap) booster vaccine.  Influenza every year before the flu season begins.  Pneumonia vaccine.  Shingles vaccine.  Your health care provider may also recommend other immunizations. This information is not intended to replace advice given to you by your health care provider. Make  sure you discuss any questions you have with your health care provider. Document Released: 11/27/2005 Document Revised: 04/24/2016 Document Reviewed: 07/09/2015 [employer] Interactive  Patient Education  2018 [employer].  Trigger Finger Trigger finger (stenosing tenosynovitis) is a condition that causes a finger to get stuck in a bent position. Each finger has a tough, cord-like tissue that connects muscle to bone (tendon), and each tendon is surrounded by a tunnel of tissue (tendon sheath). To move your finger, your tendon needs to slide freely through the sheath. Trigger finger happens when the tendon or the sheath thickens, making it difficult to move your finger. Trigger finger can affect any finger or a thumb. It may affect more than one finger. Mild cases may clear up with rest and medicine. Severe cases require more treatment. What are the causes? Trigger finger is caused by a thickened finger tendon or tendon sheath. The cause of this thickening is not known. What increases the risk? The following factors may make you more likely to develop this condition:  Doing activities that require a strong grip.  Having rheumatoid arthritis, gout, or diabetes.  Being 39-23 years old.  Being a woman.  What are the signs or symptoms? Symptoms of this condition include:  Pain when bending or straightening your finger.  Tenderness or swelling where your finger attaches to the palm of your hand.  A lump in the palm of your hand or on the inside of your finger.  Hearing a popping sound when you try to straighten your finger.  Feeling a popping, catching, or locking sensation when you try to straighten your finger.  Being unable to straighten your finger.  How is this diagnosed? This condition is diagnosed based on your symptoms and a physical exam. How is this treated? This condition may be treated by:  Resting your finger and avoiding activities that make symptoms worse.  Wearing a finger splint to keep your finger in a slightly bent position.  Taking NSAIDs to relieve pain and swelling.  Injecting medicine (steroids) into the tendon sheath to reduce swelling  and irritation. Injections may need to be repeated.  Having surgery to open the tendon sheath. This may be done if other treatments do not work and you cannot straighten your finger. You may need physical therapy after surgery.  Follow these instructions at home:  Use moist heat to help reduce pain and swelling as told by your health care provider.  Rest your finger and avoid activities that make pain worse. Return to normal activities as told by your health care provider.  If you have a splint, wear it as told by your health care provider.  Take over-the-counter and prescription medicines only as told by your health care provider.  Keep all follow-up visits as told by your health care provider. This is important. Contact a health care provider if:  Your symptoms are not improving with home care. Summary  Trigger finger (stenosing tenosynovitis) causes your finger to get stuck in a bent position, and it can make it difficult and painful to straighten your finger.  This condition develops when a finger tendon or tendon sheath thickens.  Treatment starts with resting, wearing a splint, and taking NSAIDs.  In severe cases, surgery to open the tendon sheath may be needed. This information is not intended to replace advice given to you by your health care provider. Make sure you discuss any questions you have with your health care provider. Document Released: 07/25/2004 Document Revised: 09/15/2016 Document Reviewed:  09/15/2016 Elsevier Interactive Patient Education  2017 [employer].

## 2018-01-13 NOTE — Progress Notes (Signed)
BP 133/83 (BP Location: Left Arm, Patient Position: Sitting, Cuff Size: Normal)   Pulse 72   Ht 5' 1.3" (1.557 m)   Wt 165 lb 9 oz (75.1 kg)   SpO2 98%   BMI 30.98 kg/m    Subjective:    Patient ID: Jade Smith, female    DOB: 1955/11/02, 62 y.o.   MRN: [id number]  HPI: Jade Smith is a 62 y.o. female presenting on 01/13/2018 for comprehensive medical examination. Current medical complaints include:  Having some triggering of her R pinky finger- mild restriction, no pain  HYPERLIPIDEMIA Hyperlipidemia status: excellent compliance Satisfied with current treatment?  yes Side effects:  no Medication compliance: excellent compliance Past cholesterol meds: atorvastain (lipitor) Supplements: none Aspirin:  no The 10-year ASCVD risk score Denman George DC Jr., et al., 2013) is: 3.9%   Values used to calculate the score:     Age: 54 years     Sex: Female     Is Non-Hispanic African American: No     Diabetic: No     Tobacco smoker: No     Systolic Blood Pressure: 133 mmHg     Is BP treated: No     HDL Cholesterol: 33 mg/dL     Total Cholesterol: 146 mg/dL Chest pain:  no Coronary artery disease:  no Family history CAD:  yes  Has been having migraines when she's not sleeping well and when she's stressed. She notes that she has been a bit more stressed because her dog just developed diabetes.   Everything going well with the pain doctor. No concerns.   ANXIETY/STRESS Duration:controlled Anxious mood: yes  Excessive worrying: no Irritability: no  Sweating: no Nausea: no Palpitations:no Hyperventilation: no Panic attacks: no Agoraphobia: no  Obscessions/compulsions: no Depressed mood: no Depression screen [facility] 2/9 01/13/2018 05/24/2017 12/16/2016 01/07/2016  Decreased Interest 1 0 0 3  Down, Depressed, Hopeless 0 0 0 1  PHQ - 2 Score 1 0 0 4  Altered sleeping 2 - - 0  Tired, decreased energy 2 - - 1  Change in appetite 0 - - 3  Feeling bad or failure about yourself  0 -  - 1  Trouble concentrating 0 - - 0  Moving slowly or fidgety/restless 0 - - 0  Suicidal thoughts 0 - - 0  PHQ-9 Score 5 - - 9  Difficult doing work/chores Somewhat difficult - - Very difficult   Anhedonia: no Weight changes: no Insomnia: yes hard to fall asleep  Hypersomnia: no Fatigue/loss of energy: no Feelings of worthlessness: no Feelings of guilt: no Impaired concentration/indecisiveness: no Suicidal ideations: no  Crying spells: no Recent Stressors/Life Changes: no   Relationship problems: no   Family stress: no     Financial stress: no    Job stress: no    Recent death/loss: no  She currently lives with: husband Menopausal Symptoms: yes- hot flashes  Depression Screen done today and results listed below:  Depression screen [facility] 2/9 01/13/2018 05/24/2017 12/16/2016 01/07/2016  Decreased Interest 1 0 0 3  Down, Depressed, Hopeless 0 0 0 1  PHQ - 2 Score 1 0 0 4  Altered sleeping 2 - - 0  Tired, decreased energy 2 - - 1  Change in appetite 0 - - 3  Feeling bad or failure about yourself  0 - - 1  Trouble concentrating 0 - - 0  Moving slowly or fidgety/restless 0 - - 0  Suicidal thoughts 0 - - 0  PHQ-9 Score 5 - -  9  Difficult doing work/chores Somewhat difficult - - Very difficult    Past Medical History:  Past Medical History:  Diagnosis Date  . Anxiety   . Chronic hip pain (Right) 01/07/2016  . Chronic low back pain (Location of Primary Source of Pain) (Bilateral) (R>L) 01/07/2016  . Chronic lower extremity pain (Location of Secondary source of pain) (Bilateral) (R>L) 01/07/2016   The pain going down both lower extremities appears to be referred from the lower back.   . Chronic sacroiliac joint pain (Right) 01/07/2016  . Fibromyalgia   . Hyperlipidemia   . Migraine   . OAB (overactive bladder)   . OSA (obstructive sleep apnea)   . Sleep apnea     Surgical History:  Past Surgical History:  Procedure Laterality Date  . BREAST BIOPSY Left 02/10/2016   path  pending  . BREAST EXCISIONAL BIOPSY Right    surgical bx age 79  . BREAST EXCISIONAL BIOPSY Right    surgical bx  . CARPAL TUNNEL RELEASE Bilateral   . CESAREAN SECTION    . TUBAL LIGATION      Medications:  Current Outpatient Medications on File Prior to Visit  Medication Sig  . estradiol (ESTRACE) 0.1 MG/GM vaginal cream Place 1 Applicatorful vaginally at bedtime.  . fentaNYL (DURAGESIC - DOSED MCG/HR) 50 MCG/HR Place 50 mcg onto the skin.  Marland Kitchen ibuprofen (ADVIL,MOTRIN) 200 MG tablet Take 400 mg by mouth every 6 (six) hours as needed.  . traMADol (ULTRAM) 50 MG tablet Take 50 mg by mouth as needed.    No current facility-administered medications on file prior to visit.     Allergies:  No Known Allergies  Social History:  Social History   Socioeconomic History  . Marital status: Married    Spouse name: Not on file  . Number of children: Not on file  . Years of education: Not on file  . Highest education level: Not on file  Occupational History  . Not on file  Social Needs  . Financial resource strain: Not on file  . Food insecurity:    Worry: Not on file    Inability: Not on file  . Transportation needs:    Medical: Not on file    Non-medical: Not on file  Tobacco Use  . Smoking status: Former Games developer  . Smokeless tobacco: Never Used  Substance and Sexual Activity  . Alcohol use: No  . Drug use: No  . Sexual activity: Yes  Lifestyle  . Physical activity:    Days per week: Not on file    Minutes per session: Not on file  . Stress: Not on file  Relationships  . Social connections:    Talks on phone: Not on file    Gets together: Not on file    Attends religious service: Not on file    Active member of club or organization: Not on file    Attends meetings of clubs or organizations: Not on file    Relationship status: Not on file  . Intimate partner violence:    Fear of current or ex partner: Not on file    Emotionally abused: Not on file    Physically  abused: Not on file    Forced sexual activity: Not on file  Other Topics Concern  . Not on file  Social History Narrative  . Not on file   Social History   Tobacco Use  Smoking Status Former Smoker  Smokeless Tobacco Never Used   Social History  Substance and Sexual Activity  Alcohol Use No    Family History:  Family History  Problem Relation Age of Onset  . Cancer Mother        mother had hyst to remove cancer not sure what type of cancer   . Diabetes Father   . Cancer Brother        colon  . CAD Brother     Past medical history, surgical history, medications, allergies, family history and social history reviewed with patient today and changes made to appropriate areas of the chart.   Review of Systems  Constitutional: Negative.   HENT: Negative.   Eyes: Negative.   Respiratory: Negative.   Cardiovascular: Negative.   Gastrointestinal: Negative.   Genitourinary: Negative.   Musculoskeletal: Positive for back pain and myalgias. Negative for falls, joint pain and neck pain.  Skin: Negative.   Neurological: Negative.   Endo/Heme/Allergies: Negative.   Psychiatric/Behavioral: Negative.    All other ROS negative except what is listed above and in the HPI.      Objective:    BP 133/83 (BP Location: Left Arm, Patient Position: Sitting, Cuff Size: Normal)   Pulse 72   Ht 5' 1.3" (1.557 m)   Wt 165 lb 9 oz (75.1 kg)   SpO2 98%   BMI 30.98 kg/m   Wt Readings from Last 3 Encounters:  01/13/18 165 lb 9 oz (75.1 kg)  07/16/17 156 lb (70.8 kg)  05/24/17 158 lb (71.7 kg)    Physical Exam  Constitutional: She is oriented to person, place, and time. She appears well-developed and well-nourished. No distress.  HENT:  Head: Normocephalic and atraumatic.  Right Ear: Hearing, tympanic membrane, external ear and ear canal normal.  Left Ear: Hearing, tympanic membrane, external ear and ear canal normal.  Nose: Nose normal.  Mouth/Throat: Uvula is midline, oropharynx  is clear and moist and mucous membranes are normal. No oropharyngeal exudate.  Eyes: Pupils are equal, round, and reactive to light. Conjunctivae, EOM and lids are normal. Right eye exhibits no discharge. Left eye exhibits no discharge. No scleral icterus.  Neck: Normal range of motion. Neck supple. No JVD present. No tracheal deviation present. No thyromegaly present.  Cardiovascular: Normal rate, regular rhythm, normal heart sounds and intact distal pulses. Exam reveals no gallop and no friction rub.  No murmur heard. Pulmonary/Chest: Effort normal and breath sounds normal. No stridor. No respiratory distress. She has no wheezes. She has no rales. She exhibits no tenderness. Right breast exhibits no inverted nipple, no mass, no nipple discharge, no skin change and no tenderness. Left breast exhibits no inverted nipple, no mass, no nipple discharge, no skin change and no tenderness. Breasts are symmetrical.  Abdominal: Soft. Bowel sounds are normal. She exhibits no distension and no mass. There is no tenderness. There is no rebound and no guarding.  Genitourinary:  Genitourinary Comments: Pelvic exam deferred with shared decision making   Musculoskeletal: Normal range of motion. She exhibits no edema, tenderness or deformity.  Lymphadenopathy:    She has no cervical adenopathy.  Neurological: She is alert and oriented to person, place, and time. She has normal reflexes. She displays normal reflexes. No cranial nerve deficit. She exhibits normal muscle tone. Coordination normal.  Skin: Skin is warm, dry and intact. No rash noted. She is not diaphoretic. No erythema. No pallor.  Psychiatric: She has a normal mood and affect. Her speech is normal and behavior is normal. Judgment and thought content normal. Cognition and memory are  normal.  Nursing note and vitals reviewed.   Results for orders placed or performed in visit on 07/16/17  Comprehensive metabolic panel  Result Value Ref Range    Glucose 93 65 - 99 mg/dL   BUN 22 8 - 27 mg/dL   Creatinine, Ser 1.61 0.57 - 1.00 mg/dL   GFR calc non Af Amer 96 >59 mL/min/1.73   GFR calc Af Amer 110 >59 mL/min/1.73   BUN/Creatinine Ratio 33 (H) 12 - 28   Sodium 141 134 - 144 mmol/L   Potassium 4.0 3.5 - 5.2 mmol/L   Chloride 105 96 - 106 mmol/L   CO2 23 20 - 29 mmol/L   Calcium 9.4 8.7 - 10.3 mg/dL   Total Protein 6.3 6.0 - 8.5 g/dL   Albumin 4.1 3.6 - 4.8 g/dL   Globulin, Total 2.2 1.5 - 4.5 g/dL   Albumin/Globulin Ratio 1.9 1.2 - 2.2   Bilirubin Total 1.1 0.0 - 1.2 mg/dL   Alkaline Phosphatase 61 39 - 117 IU/L   AST 16 0 - 40 IU/L   ALT 12 0 - 32 IU/L  Lipid Panel w/o Chol/HDL Ratio  Result Value Ref Range   Cholesterol, Total 146 100 - 199 mg/dL   Triglycerides 096 0 - 149 mg/dL   HDL 33 (L) >04 mg/dL   VLDL Cholesterol Cal 23 5 - 40 mg/dL   LDL Calculated 90 0 - 99 mg/dL      Assessment & Plan:   Problem List Items Addressed This Visit      Cardiovascular and Mediastinum   Acute onset aura migraine    Stable. Continue current regimen. Continue to monitor.       Relevant Medications   SUMAtriptan (IMITREX) 100 MG tablet   DULoxetine (CYMBALTA) 60 MG capsule   atorvastatin (LIPITOR) 10 MG tablet     Other   Chronic pain (Chronic)    Stable. Continue to follow with pain management. Call with any concerns.       Relevant Medications   DULoxetine (CYMBALTA) 60 MG capsule   Anxiety, generalized    Under good control. Continue current regimen. Continue to monitor. Refills given today.      Relevant Medications   DULoxetine (CYMBALTA) 60 MG capsule   busPIRone (BUSPAR) 10 MG tablet   Pure hypercholesterolemia    Under good control. Continue current regimen. Continue to monitor. Refills given today.      Relevant Medications   atorvastatin (LIPITOR) 10 MG tablet   Other Relevant Orders   CBC with Differential/Platelet   Comprehensive metabolic panel   Lipid Panel w/o Chol/HDL Ratio   Fibromyalgia     Under good control. Continue current regimen. Continue to monitor. Refills given today.      Relevant Orders   CBC with Differential/Platelet   Comprehensive metabolic panel   TSH    Other Visit Diagnoses    Routine general medical examination at a health care facility    -  Primary   Vaccines up to date. Screening labs checked today. Mammogram ordered. Pap up to date. Colonoscopy scheduled. Continue diet and exercise.    Relevant Orders   CBC with Differential/Platelet   Comprehensive metabolic panel   Lipid Panel w/o Chol/HDL Ratio   TSH   UA/M w/rflx Culture, Routine   Trigger little finger of right hand       Will get her into hand specialist. Referral generated today.   Relevant Orders   Ambulatory referral to Hand Surgery   Screening  for breast cancer       Mammogram ordered today.       Follow up plan: Return in about 6 months (around 07/16/2018) for follow up.   LABORATORY TESTING:  - Pap smear: up to date  IMMUNIZATIONS:   - Tdap: Tetanus vaccination status reviewed: last tetanus booster within 10 years. - Influenza: Up to date - Pneumovax: Up to date - Prevnar: Not applicable - Zostavax vaccine: Up to date  SCREENING: -Mammogram: Ordered today  - Colonoscopy: Ordered today  - Bone Density: Not applicable   PATIENT COUNSELING:   Advised to take 1 mg of folate supplement per day if capable of pregnancy.   Sexuality: Discussed sexually transmitted diseases, partner selection, use of condoms, avoidance of unintended pregnancy  and contraceptive alternatives.   Advised to avoid cigarette smoking.  I discussed with the patient that most people either abstain from alcohol or drink within safe limits (<=14/week and <=4 drinks/occasion for males, <=7/weeks and <= 3 drinks/occasion for females) and that the risk for alcohol disorders and other health effects rises proportionally with the number of drinks per week and how often a drinker exceeds daily  limits.  Discussed cessation/primary prevention of drug use and availability of treatment for abuse.   Diet: Encouraged to adjust caloric intake to maintain  or achieve ideal body weight, to reduce intake of dietary saturated fat and total fat, to limit sodium intake by avoiding high sodium foods and not adding table salt, and to maintain adequate dietary potassium and calcium preferably from fresh fruits, vegetables, and low-fat dairy products.    stressed the importance of regular exercise  Injury prevention: Discussed safety belts, safety helmets, smoke detector, smoking near bedding or upholstery.   Dental health: Discussed importance of regular tooth brushing, flossing, and dental visits.    NEXT PREVENTATIVE PHYSICAL DUE IN 1 YEAR. Return in about 6 months (around 07/16/2018) for follow up.

## 2018-01-14 LAB — CBC WITH DIFFERENTIAL/PLATELET
Basophils Absolute: 0.1 10*3/uL (ref 0.0–0.2)
Basos: 1 %
EOS (ABSOLUTE): 0.2 10*3/uL (ref 0.0–0.4)
EOS: 2 %
Hematocrit: 43.1 % (ref 34.0–46.6)
Hemoglobin: 15.6 g/dL (ref 11.1–15.9)
IMMATURE GRANULOCYTES: 0 %
Immature Grans (Abs): 0 10*3/uL (ref 0.0–0.1)
LYMPHS ABS: 1.8 10*3/uL (ref 0.7–3.1)
Lymphs: 21 %
MCH: 33.4 pg — ABNORMAL HIGH (ref 26.6–33.0)
MCHC: 36.2 g/dL — ABNORMAL HIGH (ref 31.5–35.7)
MCV: 92 fL (ref 79–97)
MONOS ABS: 0.5 10*3/uL (ref 0.1–0.9)
Monocytes: 6 %
NEUTROS PCT: 70 %
Neutrophils Absolute: 5.8 10*3/uL (ref 1.4–7.0)
Platelets: 215 10*3/uL (ref 150–379)
RBC: 4.67 x10E6/uL (ref 3.77–5.28)
RDW: 13.9 % (ref 12.3–15.4)
WBC: 8.3 10*3/uL (ref 3.4–10.8)

## 2018-01-14 LAB — LIPID PANEL W/O CHOL/HDL RATIO
Cholesterol, Total: 172 mg/dL (ref 100–199)
HDL: 50 mg/dL (ref >39)
LDL Calculated: 98 mg/dL (ref 0–99)
Triglycerides: 118 mg/dL (ref 0–149)
VLDL Cholesterol Cal: 24 mg/dL (ref 5–40)

## 2018-01-14 LAB — COMPREHENSIVE METABOLIC PANEL
A/G RATIO: 1.8 (ref 1.2–2.2)
ALT: 19 IU/L (ref 0–32)
AST: 18 IU/L (ref 0–40)
Albumin: 4.4 g/dL (ref 3.6–4.8)
Alkaline Phosphatase: 74 IU/L (ref 39–117)
BUN/Creatinine Ratio: 36 — ABNORMAL HIGH (ref 12–28)
BUN: 23 mg/dL (ref 8–27)
Bilirubin Total: 1.1 mg/dL (ref 0.0–1.2)
CALCIUM: 9.4 mg/dL (ref 8.7–10.3)
CO2: 20 mmol/L (ref 20–29)
Chloride: 104 mmol/L (ref 96–106)
Creatinine, Ser: 0.64 mg/dL (ref 0.57–1.00)
GFR, EST AFRICAN AMERICAN: 112 mL/min/{1.73_m2} (ref >59)
GFR, EST NON AFRICAN AMERICAN: 97 mL/min/{1.73_m2} (ref >59)
Globulin, Total: 2.5 g/dL (ref 1.5–4.5)
Glucose: 101 mg/dL — ABNORMAL HIGH (ref 65–99)
POTASSIUM: 4.1 mmol/L (ref 3.5–5.2)
Sodium: 140 mmol/L (ref 134–144)
TOTAL PROTEIN: 6.9 g/dL (ref 6.0–8.5)

## 2018-01-14 LAB — TSH: TSH: 1.81 u[IU]/mL (ref 0.450–4.500)

## 2018-01-17 ENCOUNTER — Encounter: Payer: Self-pay | Admitting: Family Medicine

## 2018-04-15 ENCOUNTER — Other Ambulatory Visit: Payer: Self-pay | Admitting: Physical Medicine and Rehabilitation

## 2018-04-16 ENCOUNTER — Other Ambulatory Visit: Payer: Self-pay | Admitting: Physical Medicine and Rehabilitation

## 2018-04-16 DIAGNOSIS — M545 Low back pain: Secondary | ICD-10-CM

## 2018-04-25 ENCOUNTER — Ambulatory Visit
Admission: RE | Admit: 2018-04-25 | Discharge: 2018-04-25 | Disposition: A | Discharge status: Home / Self Care | Payer: BLUE CROSS/BLUE SHIELD | Source: Ambulatory Visit | Attending: Physical Medicine and Rehabilitation | Admitting: Physical Medicine and Rehabilitation

## 2018-04-25 DIAGNOSIS — M545 Low back pain: Secondary | ICD-10-CM

## 2018-05-04 ENCOUNTER — Encounter: Payer: Self-pay | Admitting: Family Medicine

## 2018-05-04 ENCOUNTER — Ambulatory Visit (INDEPENDENT_AMBULATORY_CARE_PROVIDER_SITE_OTHER): Payer: BLUE CROSS/BLUE SHIELD | Admitting: Family Medicine

## 2018-05-04 VITALS — BP 142/85 | HR 59 | Temp 98.0°F | Ht 61.3 in | Wt 167.2 lb

## 2018-05-04 DIAGNOSIS — R232 Flushing: Secondary | ICD-10-CM

## 2018-05-04 MED ORDER — GABAPENTIN 100 MG PO CAPS: ORAL_CAPSULE | ORAL | 3 refills | Status: DC

## 2018-05-04 NOTE — Progress Notes (Signed)
BP (!) 142/85 (BP Location: Left Arm, Patient Position: Sitting, Cuff Size: Normal)   Pulse (!) 59   Temp 98 F (36.7 C)   Ht 5' 1.3" (1.557 m)   Wt 167 lb 3 oz (75.8 kg)   SpO2 98%   BMI 31.28 kg/m    Subjective:    Patient ID: Jade Smith, female    DOB: September 21, 1956, 62 y.o.   MRN: [id number]  HPI: Jade Smith is a 62 y.o. female  Chief Complaint  Patient presents with  . Hot Flashes   MENOPAUSAL SYMPTOMS Duration: 20 years Status: exacerbated Symptom severity: severe Hot flashes: yes Night sweats: yes Sleep disturbances: yes Vaginal dryness: no Dyspareunia:yes Decreased libido: yes Emotional lability: no Stress incontinence: no Previous HRT/pharmacotherapy: yes- estradiol and progestrin, not helping, helped a little bit at first, but hasn't been good for about a year Hysterectomy: no Absolute Contraindications to Hormonal Therapy:     Undiagnosed vaginal bleeding: no    Breast cancer: no    Endometrial cancer: no    Coronary disease: no    Cerebrovascular disease: no    Venous thromboembolic disease: no   Relevant past medical, surgical, family and social history reviewed and updated as indicated. Interim medical history since our last visit reviewed. Allergies and medications reviewed and updated.  Review of Systems  Constitutional: Positive for diaphoresis. Negative for activity change, appetite change, chills, fatigue, fever and unexpected weight change.  Respiratory: Negative.   Cardiovascular: Negative.   Musculoskeletal: Positive for back pain. Negative for arthralgias, gait problem, joint swelling, myalgias, neck pain and neck stiffness.  Skin: Negative.   Psychiatric/Behavioral: Negative.     Per HPI unless specifically indicated above     Objective:    BP (!) 142/85 (BP Location: Left Arm, Patient Position: Sitting, Cuff Size: Normal)   Pulse (!) 59   Temp 98 F (36.7 C)   Ht 5' 1.3" (1.557 m)   Wt 167 lb 3 oz (75.8 kg)   SpO2  98%   BMI 31.28 kg/m   Wt Readings from Last 3 Encounters:  05/04/18 167 lb 3 oz (75.8 kg)  01/13/18 165 lb 9 oz (75.1 kg)  07/16/17 156 lb (70.8 kg)    Physical Exam  Constitutional: She is oriented to person, place, and time. She appears well-developed and well-nourished. No distress.  HENT:  Head: Normocephalic and atraumatic.  Right Ear: Hearing normal.  Left Ear: Hearing normal.  Nose: Nose normal.  Eyes: Conjunctivae and lids are normal. Right eye exhibits no discharge. Left eye exhibits no discharge. No scleral icterus.  Cardiovascular: Normal rate, regular rhythm, normal heart sounds and intact distal pulses. Exam reveals no gallop and no friction rub.  No murmur heard. Pulmonary/Chest: Effort normal and breath sounds normal. No stridor. No respiratory distress. She has no wheezes. She has no rales. She exhibits no tenderness.  Musculoskeletal: Normal range of motion.  Neurological: She is alert and oriented to person, place, and time.  Skin: Skin is warm, dry and intact. Capillary refill takes less than 2 seconds. No rash noted. She is not diaphoretic. No erythema. No pallor.  Psychiatric: She has a normal mood and affect. Her speech is normal and behavior is normal. Judgment and thought content normal. Cognition and memory are normal.    Results for orders placed or performed in visit on 01/13/18  CBC with Differential/Platelet  Result Value Ref Range   WBC 8.3 3.4 - 10.8 x10E3/uL   RBC 4.67 3.77 - 5.28  x10E6/uL   Hemoglobin 15.6 11.1 - 15.9 g/dL   Hematocrit 04.543.1 40.934.0 - 46.6 %   MCV 92 79 - 97 fL   MCH 33.4 (H) 26.6 - 33.0 pg   MCHC 36.2 (H) 31.5 - 35.7 g/dL   RDW 81.113.9 91.412.3 - 78.215.4 %   Platelets 215 150 - 379 x10E3/uL   Neutrophils 70 Not Estab. %   Lymphs 21 Not Estab. %   Monocytes 6 Not Estab. %   Eos 2 Not Estab. %   Basos 1 Not Estab. %   Neutrophils Absolute 5.8 1.4 - 7.0 x10E3/uL   Lymphocytes Absolute 1.8 0.7 - 3.1 x10E3/uL   Monocytes Absolute 0.5 0.1 -  0.9 x10E3/uL   EOS (ABSOLUTE) 0.2 0.0 - 0.4 x10E3/uL   Basophils Absolute 0.1 0.0 - 0.2 x10E3/uL   Immature Granulocytes 0 Not Estab. %   Immature Grans (Abs) 0.0 0.0 - 0.1 x10E3/uL  Comprehensive metabolic panel  Result Value Ref Range   Glucose 101 (H) 65 - 99 mg/dL   BUN 23 8 - 27 mg/dL   Creatinine, Ser 9.560.64 0.57 - 1.00 mg/dL   GFR calc non Af Amer 97 >59 mL/min/1.73   GFR calc Af Amer 112 >59 mL/min/1.73   BUN/Creatinine Ratio 36 (H) 12 - 28   Sodium 140 134 - 144 mmol/L   Potassium 4.1 3.5 - 5.2 mmol/L   Chloride 104 96 - 106 mmol/L   CO2 20 20 - 29 mmol/L   Calcium 9.4 8.7 - 10.3 mg/dL   Total Protein 6.9 6.0 - 8.5 g/dL   Albumin 4.4 3.6 - 4.8 g/dL   Globulin, Total 2.5 1.5 - 4.5 g/dL   Albumin/Globulin Ratio 1.8 1.2 - 2.2   Bilirubin Total 1.1 0.0 - 1.2 mg/dL   Alkaline Phosphatase 74 39 - 117 IU/L   AST 18 0 - 40 IU/L   ALT 19 0 - 32 IU/L  Lipid Panel w/o Chol/HDL Ratio  Result Value Ref Range   Cholesterol, Total 172 100 - 199 mg/dL   Triglycerides 213118 0 - 149 mg/dL   HDL 50 >08>39 mg/dL   VLDL Cholesterol Cal 24 5 - 40 mg/dL   LDL Calculated 98 0 - 99 mg/dL  TSH  Result Value Ref Range   TSH 1.810 0.450 - 4.500 uIU/mL  UA/M w/rflx Culture, Routine  Result Value Ref Range   Specific Gravity, UA 1.020 1.005 - 1.030   pH, UA 6.5 5.0 - 7.5   Color, UA Orange Yellow   Appearance Ur Hazy (A) Clear   Leukocytes, UA Negative Negative   Protein, UA Trace (A) Negative/Trace   Glucose, UA Negative Negative   Ketones, UA Negative Negative   RBC, UA Negative Negative   Bilirubin, UA Negative Negative   Urobilinogen, Ur 0.2 0.2 - 1.0 mg/dL   Nitrite, UA Negative Negative      Assessment & Plan:   Problem List Items Addressed This Visit    None    Visit Diagnoses    Hot flashes    -  Primary   Not under good control. Unclear if this is hormonal or medication related. Will start gabapentin and check labs. Recheck 1 month.    Relevant Orders   Thyroid Panel With  TSH   CBC with Differential/Platelet   Comprehensive metabolic panel       Follow up plan: Return in about 1 month (around 06/01/2018) for follow up hot flashes.

## 2018-05-05 ENCOUNTER — Encounter: Payer: Self-pay | Admitting: Family Medicine

## 2018-05-05 LAB — CBC WITH DIFFERENTIAL/PLATELET
Basophils Absolute: 0.1 10*3/uL (ref 0.0–0.2)
Basos: 1 %
EOS (ABSOLUTE): 0.2 10*3/uL (ref 0.0–0.4)
EOS: 2 %
HEMATOCRIT: 45.8 % (ref 34.0–46.6)
HEMOGLOBIN: 14.8 g/dL (ref 11.1–15.9)
Immature Grans (Abs): 0.1 10*3/uL (ref 0.0–0.1)
Immature Granulocytes: 1 %
LYMPHS ABS: 2.8 10*3/uL (ref 0.7–3.1)
Lymphs: 29 %
MCH: 30.6 pg (ref 26.6–33.0)
MCHC: 32.3 g/dL (ref 31.5–35.7)
MCV: 95 fL (ref 79–97)
MONOCYTES: 6 %
MONOS ABS: 0.6 10*3/uL (ref 0.1–0.9)
NEUTROS ABS: 5.8 10*3/uL (ref 1.4–7.0)
Neutrophils: 61 %
Platelets: 216 10*3/uL (ref 150–450)
RBC: 4.83 x10E6/uL (ref 3.77–5.28)
RDW: 13 % (ref 12.3–15.4)
WBC: 9.4 10*3/uL (ref 3.4–10.8)

## 2018-05-05 LAB — COMPREHENSIVE METABOLIC PANEL
A/G RATIO: 1.7 (ref 1.2–2.2)
ALBUMIN: 4.5 g/dL (ref 3.6–4.8)
ALK PHOS: 69 IU/L (ref 39–117)
ALT: 12 IU/L (ref 0–32)
AST: 16 IU/L (ref 0–40)
BILIRUBIN TOTAL: 0.9 mg/dL (ref 0.0–1.2)
BUN / CREAT RATIO: 26 (ref 12–28)
BUN: 16 mg/dL (ref 8–27)
CHLORIDE: 104 mmol/L (ref 96–106)
CO2: 19 mmol/L — ABNORMAL LOW (ref 20–29)
Calcium: 9.3 mg/dL (ref 8.7–10.3)
Creatinine, Ser: 0.62 mg/dL (ref 0.57–1.00)
GFR calc Af Amer: 113 mL/min/{1.73_m2} (ref >59)
GFR calc non Af Amer: 98 mL/min/{1.73_m2} (ref >59)
GLOBULIN, TOTAL: 2.6 g/dL (ref 1.5–4.5)
Glucose: 73 mg/dL (ref 65–99)
POTASSIUM: 4 mmol/L (ref 3.5–5.2)
SODIUM: 142 mmol/L (ref 134–144)
Total Protein: 7.1 g/dL (ref 6.0–8.5)

## 2018-05-05 LAB — THYROID PANEL WITH TSH
FREE THYROXINE INDEX: 1.7 (ref 1.2–4.9)
T3 Uptake Ratio: 24 % (ref 24–39)
T4 TOTAL: 7.1 ug/dL (ref 4.5–12.0)
TSH: 2.86 u[IU]/mL (ref 0.450–4.500)

## 2018-07-18 ENCOUNTER — Ambulatory Visit: Payer: BLUE CROSS/BLUE SHIELD | Admitting: Family Medicine

## 2018-07-29 ENCOUNTER — Ambulatory Visit: Payer: BLUE CROSS/BLUE SHIELD | Admitting: Family Medicine

## 2018-09-07 ENCOUNTER — Encounter: Payer: Self-pay | Admitting: Family Medicine

## 2018-09-07 ENCOUNTER — Ambulatory Visit (INDEPENDENT_AMBULATORY_CARE_PROVIDER_SITE_OTHER): Payer: BLUE CROSS/BLUE SHIELD | Admitting: Family Medicine

## 2018-09-07 VITALS — BP 151/83 | HR 83 | Temp 98.8°F | Ht 61.3 in | Wt 168.6 lb

## 2018-09-07 DIAGNOSIS — F411 Generalized anxiety disorder: Secondary | ICD-10-CM

## 2018-09-07 DIAGNOSIS — E7800 Pure hypercholesterolemia, unspecified: Secondary | ICD-10-CM

## 2018-09-07 DIAGNOSIS — Z1239 Encounter for other screening for malignant neoplasm of breast: Secondary | ICD-10-CM

## 2018-09-07 MED ORDER — DULOXETINE HCL 60 MG PO CPEP: 60.0000 mg | ORAL_CAPSULE | Freq: Every day | ORAL | 1 refills | Status: DC

## 2018-09-07 MED ORDER — OXYBUTYNIN CHLORIDE ER 10 MG PO TB24: 10.0000 mg | ORAL_TABLET | Freq: Every day | ORAL | 1 refills | Status: DC

## 2018-09-07 MED ORDER — ATORVASTATIN CALCIUM 10 MG PO TABS: 10.0000 mg | ORAL_TABLET | Freq: Every day | ORAL | 1 refills | Status: DC

## 2018-09-07 MED ORDER — BUSPIRONE HCL 10 MG PO TABS: 10.0000 mg | ORAL_TABLET | Freq: Two times a day (BID) | ORAL | 1 refills | Status: DC

## 2018-09-07 NOTE — Assessment & Plan Note (Signed)
Under good control on current regimen. Continue current regimen. Continue to monitor. Call with any concerns. Refills given. Labs checked today.  

## 2018-09-07 NOTE — Patient Instructions (Signed)
[facility]  Address: [address], [place], [place] [address]  Phone: [phone]  

## 2018-09-07 NOTE — Progress Notes (Addendum)
BP (!) 151/83 (BP Location: Right Arm, Patient Position: Sitting, Cuff Size: Normal)   Pulse 83   Temp 98.8 F (37.1 C)   Ht 5' 1.3" (1.557 m)   Wt 168 lb 9 oz (76.5 kg)   SpO2 98%   BMI 31.54 kg/m    Subjective:    Patient ID: Jade Smith, female    DOB: April 08, 1956, 62 y.o.   MRN: [id number]  HPI: Jade Smith is a 62 y.o. female  Chief Complaint  Patient presents with  . Hyperlipidemia  . Anxiety   Has been having pain in her legs, working with pain management on trying to figure out what's going on. She notes that it seems to only happy when she is laying down at night. She notes that her back is doing better. They are working on getting her legs better. This started about a month ago. Has been walking a lot more. Has been doing hamstring stretches.  ANXIETY/STRESS Duration:stable Anxious mood: no  Excessive worrying: no Irritability: no  Sweating: no Nausea: no Palpitations:no Hyperventilation: no Panic attacks: no Agoraphobia: no  Obscessions/compulsions: no Depressed mood: no Depression screen [facility] 2/9 09/07/2018 01/13/2018 05/24/2017 12/16/2016 01/07/2016  Decreased Interest 1 1 0 0 3  Down, Depressed, Hopeless 1 0 0 0 1  PHQ - 2 Score 2 1 0 0 4  Altered sleeping 3 2 - - 0  Tired, decreased energy 0 2 - - 1  Change in appetite 0 0 - - 3  Feeling bad or failure about yourself  0 0 - - 1  Trouble concentrating 0 0 - - 0  Moving slowly or fidgety/restless 1 0 - - 0  Suicidal thoughts 0 0 - - 0  PHQ-9 Score 6 5 - - 9  Difficult doing work/chores Somewhat difficult Somewhat difficult - - Very difficult   GAD 7 : Generalized Anxiety Score 09/07/2018 11/26/2016  Nervous, Anxious, on Edge 0 3  Control/stop worrying 3 3  Worry too much - different things 0 3  Trouble relaxing 0 2  Restless 0 2  Easily annoyed or irritable 0 1  Afraid - awful might happen 3 3  Total GAD 7 Score 6 17  Anxiety Difficulty Not difficult at all Very difficult   Anhedonia:  no Weight changes: no Insomnia: no   Hypersomnia: no Fatigue/loss of energy: no Feelings of worthlessness: no Feelings of guilt: no Impaired concentration/indecisiveness: no Suicidal ideations: no  Crying spells: no Recent Stressors/Life Changes: no   Relationship problems: no   Family stress: no     Financial stress: no    Job stress: no    Recent death/loss: no  HYPERLIPIDEMIA Hyperlipidemia status: excellent compliance Satisfied with current treatment?  yes Side effects:  no Medication compliance: excellent compliance Past cholesterol meds: atorvastatin Supplements: none Aspirin:  no The 10-year ASCVD risk score Denman George DC Jr., et al., 2013) is: 4.8%   Values used to calculate the score:     Age: 46 years     Sex: Female     Is Non-Hispanic African American: No     Diabetic: No     Tobacco smoker: No     Systolic Blood Pressure: 151 mmHg     Is BP treated: No     HDL Cholesterol: 50 mg/dL     Total Cholesterol: 172 mg/dL Chest pain:  no Coronary artery disease:  no   Relevant past medical, surgical, family and social history reviewed and updated as indicated. Interim medical  history since our last visit reviewed. Allergies and medications reviewed and updated.  Review of Systems  Constitutional: Negative.   Respiratory: Negative.   Cardiovascular: Negative.   Musculoskeletal: Positive for arthralgias, back pain and myalgias. Negative for gait problem, joint swelling, neck pain and neck stiffness.  Skin: Negative.   Neurological: Negative.   Psychiatric/Behavioral: Negative.     Per HPI unless specifically indicated above     Objective:    BP (!) 151/83 (BP Location: Right Arm, Patient Position: Sitting, Cuff Size: Normal)   Pulse 83   Temp 98.8 F (37.1 C)   Ht 5' 1.3" (1.557 m)   Wt 168 lb 9 oz (76.5 kg)   SpO2 98%   BMI 31.54 kg/m   Wt Readings from Last 3 Encounters:  09/07/18 168 lb 9 oz (76.5 kg)  05/04/18 167 lb 3 oz (75.8 kg)  01/13/18 165  lb 9 oz (75.1 kg)    Physical Exam  Constitutional: She is oriented to person, place, and time. She appears well-developed and well-nourished. No distress.  HENT:  Head: Normocephalic and atraumatic.  Right Ear: Hearing normal.  Left Ear: Hearing normal.  Nose: Nose normal.  Eyes: Conjunctivae and lids are normal. Right eye exhibits no discharge. Left eye exhibits no discharge. No scleral icterus.  Cardiovascular: Normal rate, regular rhythm, normal heart sounds and intact distal pulses. Exam reveals no gallop and no friction rub.  No murmur heard. Pulmonary/Chest: Effort normal and breath sounds normal. No stridor. No respiratory distress. She has no wheezes. She has no rales. She exhibits no tenderness.  Musculoskeletal: Normal range of motion.  Neurological: She is alert and oriented to person, place, and time.  Skin: Skin is warm, dry and intact. Capillary refill takes less than 2 seconds. No rash noted. She is not diaphoretic. No erythema. No pallor.  Psychiatric: She has a normal mood and affect. Her speech is normal and behavior is normal. Judgment and thought content normal. Cognition and memory are normal.  Nursing note and vitals reviewed.   Results for orders placed or performed in visit on 05/04/18  Thyroid Panel With TSH  Result Value Ref Range   TSH 2.860 0.450 - 4.500 uIU/mL   T4, Total 7.1 4.5 - 12.0 ug/dL   T3 Uptake Ratio 24 24 - 39 %   Free Thyroxine Index 1.7 1.2 - 4.9  CBC with Differential/Platelet  Result Value Ref Range   WBC 9.4 3.4 - 10.8 x10E3/uL   RBC 4.83 3.77 - 5.28 x10E6/uL   Hemoglobin 14.8 11.1 - 15.9 g/dL   Hematocrit 16.145.8 09.634.0 - 46.6 %   MCV 95 79 - 97 fL   MCH 30.6 26.6 - 33.0 pg   MCHC 32.3 31.5 - 35.7 g/dL   RDW 04.513.0 40.912.3 - 81.115.4 %   Platelets 216 150 - 450 x10E3/uL   Neutrophils 61 Not Estab. %   Lymphs 29 Not Estab. %   Monocytes 6 Not Estab. %   Eos 2 Not Estab. %   Basos 1 Not Estab. %   Neutrophils Absolute 5.8 1.4 - 7.0 x10E3/uL    Lymphocytes Absolute 2.8 0.7 - 3.1 x10E3/uL   Monocytes Absolute 0.6 0.1 - 0.9 x10E3/uL   EOS (ABSOLUTE) 0.2 0.0 - 0.4 x10E3/uL   Basophils Absolute 0.1 0.0 - 0.2 x10E3/uL   Immature Granulocytes 1 Not Estab. %   Immature Grans (Abs) 0.1 0.0 - 0.1 x10E3/uL  Comprehensive metabolic panel  Result Value Ref Range   Glucose 73 65 -  99 mg/dL   BUN 16 8 - 27 mg/dL   Creatinine, Ser 4.09 0.57 - 1.00 mg/dL   GFR calc non Af Amer 98 >59 mL/min/1.73   GFR calc Af Amer 113 >59 mL/min/1.73   BUN/Creatinine Ratio 26 12 - 28   Sodium 142 134 - 144 mmol/L   Potassium 4.0 3.5 - 5.2 mmol/L   Chloride 104 96 - 106 mmol/L   CO2 19 (L) 20 - 29 mmol/L   Calcium 9.3 8.7 - 10.3 mg/dL   Total Protein 7.1 6.0 - 8.5 g/dL   Albumin 4.5 3.6 - 4.8 g/dL   Globulin, Total 2.6 1.5 - 4.5 g/dL   Albumin/Globulin Ratio 1.7 1.2 - 2.2   Bilirubin Total 0.9 0.0 - 1.2 mg/dL   Alkaline Phosphatase 69 39 - 117 IU/L   AST 16 0 - 40 IU/L   ALT 12 0 - 32 IU/L      Assessment & Plan:   Problem List Items Addressed This Visit      Other   Anxiety, generalized - Primary    Under good control on current regimen. Continue current regimen. Continue to monitor. Call with any concerns. Refills given. Labs checked today.       Relevant Medications   busPIRone (BUSPAR) 10 MG tablet   DULoxetine (CYMBALTA) 60 MG capsule   Pure hypercholesterolemia    Under good control on current regimen. Continue current regimen. Continue to monitor. Call with any concerns. Refills given. Labs checked today.      Relevant Medications   atorvastatin (LIPITOR) 10 MG tablet   Other Relevant Orders   Comprehensive metabolic panel   Lipid Panel w/o Chol/HDL Ratio    Other Visit Diagnoses    Screening for breast cancer       Mammogram ordered today   Relevant Orders   MM Digital Screening       Follow up plan: Return in about 6 months (around 03/08/2019) for Physical.

## 2018-09-08 ENCOUNTER — Encounter: Payer: Self-pay | Admitting: Family Medicine

## 2018-09-08 LAB — COMPREHENSIVE METABOLIC PANEL
A/G RATIO: 2 (ref 1.2–2.2)
ALT: 20 IU/L (ref 0–32)
AST: 22 IU/L (ref 0–40)
Albumin: 4.7 g/dL (ref 3.6–4.8)
Alkaline Phosphatase: 79 IU/L (ref 39–117)
BILIRUBIN TOTAL: 1.7 mg/dL — AB (ref 0.0–1.2)
BUN/Creatinine Ratio: 31 — ABNORMAL HIGH (ref 12–28)
BUN: 21 mg/dL (ref 8–27)
CALCIUM: 9.5 mg/dL (ref 8.7–10.3)
CHLORIDE: 100 mmol/L (ref 96–106)
CO2: 22 mmol/L (ref 20–29)
Creatinine, Ser: 0.68 mg/dL (ref 0.57–1.00)
GFR calc Af Amer: 109 mL/min/{1.73_m2} (ref >59)
GFR calc non Af Amer: 95 mL/min/{1.73_m2} (ref >59)
GLUCOSE: 96 mg/dL (ref 65–99)
Globulin, Total: 2.3 g/dL (ref 1.5–4.5)
POTASSIUM: 3.9 mmol/L (ref 3.5–5.2)
Sodium: 138 mmol/L (ref 134–144)
Total Protein: 7 g/dL (ref 6.0–8.5)

## 2018-09-08 LAB — LIPID PANEL W/O CHOL/HDL RATIO
Cholesterol, Total: 166 mg/dL (ref 100–199)
HDL: 44 mg/dL (ref >39)
LDL Calculated: 98 mg/dL (ref 0–99)
TRIGLYCERIDES: 120 mg/dL (ref 0–149)
VLDL Cholesterol Cal: 24 mg/dL (ref 5–40)

## 2019-03-09 ENCOUNTER — Ambulatory Visit (INDEPENDENT_AMBULATORY_CARE_PROVIDER_SITE_OTHER): Payer: BLUE CROSS/BLUE SHIELD | Admitting: Family Medicine

## 2019-03-09 ENCOUNTER — Other Ambulatory Visit: Payer: Self-pay

## 2019-03-09 ENCOUNTER — Encounter: Payer: Self-pay | Admitting: Family Medicine

## 2019-03-09 DIAGNOSIS — G894 Chronic pain syndrome: Secondary | ICD-10-CM

## 2019-03-09 DIAGNOSIS — M797 Fibromyalgia: Secondary | ICD-10-CM

## 2019-03-09 DIAGNOSIS — R21 Rash and other nonspecific skin eruption: Secondary | ICD-10-CM

## 2019-03-09 DIAGNOSIS — R232 Flushing: Secondary | ICD-10-CM

## 2019-03-09 MED ORDER — MEDROXYPROGESTERONE ACETATE 2.5 MG PO TABS: 2.5000 mg | ORAL_TABLET | Freq: Every day | ORAL | 3 refills | Status: DC

## 2019-03-09 MED ORDER — ESTRADIOL 2 MG PO TABS: 2.0000 mg | ORAL_TABLET | Freq: Every day | ORAL | 3 refills | Status: AC

## 2019-03-09 MED ORDER — TRIAMCINOLONE ACETONIDE 0.5 % EX OINT: 1.0000 "application " | TOPICAL_OINTMENT | Freq: Two times a day (BID) | CUTANEOUS | 0 refills | Status: AC

## 2019-03-09 MED ORDER — ATORVASTATIN CALCIUM 10 MG PO TABS: 10.0000 mg | ORAL_TABLET | Freq: Every day | ORAL | 1 refills | Status: DC

## 2019-03-09 MED ORDER — MUPIROCIN 2 % EX OINT: 1.0000 "application " | TOPICAL_OINTMENT | Freq: Two times a day (BID) | CUTANEOUS | 0 refills | Status: DC

## 2019-03-09 MED ORDER — GABAPENTIN 100 MG PO CAPS: ORAL_CAPSULE | ORAL | 3 refills | Status: DC

## 2019-03-09 MED ORDER — DULOXETINE HCL 60 MG PO CPEP: 60.0000 mg | ORAL_CAPSULE | Freq: Every day | ORAL | 1 refills | Status: DC

## 2019-03-09 MED ORDER — SUMATRIPTAN SUCCINATE 100 MG PO TABS: 100.0000 mg | ORAL_TABLET | ORAL | 12 refills | Status: DC | PRN

## 2019-03-09 MED ORDER — OXYBUTYNIN CHLORIDE ER 15 MG PO TB24: 15.0000 mg | ORAL_TABLET | Freq: Every day | ORAL | 1 refills | Status: DC

## 2019-03-09 NOTE — Progress Notes (Signed)
There were no vitals taken for this visit.   Subjective:    Patient ID: Jade Smith, female    DOB: 06-Jun-1956, 63 y.o.   MRN: [id number]  HPI: Kami Maulden is a 63 y.o. female  Chief Complaint  Patient presents with  . Hot Flashes  . Insomnia   MENOPAUSAL SYMPTOMS- has been having a lot of sweating even with rolling out of bed. She stopped her gabapentin- unclear why. She is already taking  Duration: chronic- worse in the last few months Status: exacerbated Symptom severity: severe Hot flashes: yes Night sweats: yes Sleep disturbances: yes Vaginal dryness: no Dyspareunia:no Decreased libido: no Emotional lability: no Stress incontinence: yes Previous HRT/pharmacotherapy: yes Absolute Contraindications to Hormonal Therapy:     Undiagnosed vaginal bleeding: no    Breast cancer: no    Endometrial cancer: no    Coronary disease: no    Cerebrovascular disease: no    Venous thromboembolic disease: no  RASH Duration:  3 months  Location: R arm  Itching: yes Burning: no Redness: no Oozing: no Scaling: yes Blisters: no Painful: no Fevers: no Change in detergents/soaps/personal care products: no Recent illness: no Recent travel:no History of same: no Context: stable Alleviating factors: nothing Treatments attempted:nothing Shortness of breath: no  Throat/tongue swelling: no Myalgias/arthralgias: no   Relevant past medical, surgical, family and social history reviewed and updated as indicated. Interim medical history since our last visit reviewed. Allergies and medications reviewed and updated.  Review of Systems  Constitutional: Positive for diaphoresis. Negative for activity change, appetite change, chills, fatigue, fever and unexpected weight change.  Respiratory: Negative.   Cardiovascular: Negative.   Gastrointestinal: Negative.   Musculoskeletal: Negative.   Skin: Positive for rash.  Psychiatric/Behavioral: Positive for sleep disturbance.  Negative for agitation, behavioral problems, confusion, decreased concentration, dysphoric mood, hallucinations, self-injury and suicidal ideas. The patient is not nervous/anxious and is not hyperactive.     Per HPI unless specifically indicated above     Objective:    There were no vitals taken for this visit.  Wt Readings from Last 3 Encounters:  09/07/18 168 lb 9 oz (76.5 kg)  05/04/18 167 lb 3 oz (75.8 kg)  01/13/18 165 lb 9 oz (75.1 kg)    Physical Exam Vitals signs and nursing note reviewed.  Constitutional:      General: She is not in acute distress.    Appearance: Normal appearance. She is not ill-appearing, toxic-appearing or diaphoretic.  HENT:     Head: Normocephalic and atraumatic.     Right Ear: External ear normal.     Left Ear: External ear normal.     Nose: Nose normal.     Mouth/Throat:     Mouth: Mucous membranes are moist.     Pharynx: Oropharynx is clear.  Eyes:     General: No scleral icterus.       Right eye: No discharge.        Left eye: No discharge.     Conjunctiva/sclera: Conjunctivae normal.     Pupils: Pupils are equal, round, and reactive to light.  Neck:     Musculoskeletal: Normal range of motion.  Pulmonary:     Effort: Pulmonary effort is normal. No respiratory distress.     Comments: Speaking in full sentences Musculoskeletal: Normal range of motion.  Skin:    Coloration: Skin is not jaundiced or pale.     Findings: Rash (patches of erythematoud unhealing wounds on R arm) present. No bruising, erythema or lesion.  Neurological:     Mental Status: She is alert and oriented to person, place, and time. Mental status is at baseline.  Psychiatric:        Mood and Affect: Mood normal.        Behavior: Behavior normal.        Thought Content: Thought content normal.        Judgment: Judgment normal.     Results for orders placed or performed in visit on 09/07/18  Comprehensive metabolic panel  Result Value Ref Range   Glucose 96 65 -  99 mg/dL   BUN 21 8 - 27 mg/dL   Creatinine, Ser 1.610.68 0.57 - 1.00 mg/dL   GFR calc non Af Amer 95 >59 mL/min/1.73   GFR calc Af Amer 109 >59 mL/min/1.73   BUN/Creatinine Ratio 31 (H) 12 - 28   Sodium 138 134 - 144 mmol/L   Potassium 3.9 3.5 - 5.2 mmol/L   Chloride 100 96 - 106 mmol/L   CO2 22 20 - 29 mmol/L   Calcium 9.5 8.7 - 10.3 mg/dL   Total Protein 7.0 6.0 - 8.5 g/dL   Albumin 4.7 3.6 - 4.8 g/dL   Globulin, Total 2.3 1.5 - 4.5 g/dL   Albumin/Globulin Ratio 2.0 1.2 - 2.2   Bilirubin Total 1.7 (H) 0.0 - 1.2 mg/dL   Alkaline Phosphatase 79 39 - 117 IU/L   AST 22 0 - 40 IU/L   ALT 20 0 - 32 IU/L  Lipid Panel w/o Chol/HDL Ratio  Result Value Ref Range   Cholesterol, Total 166 100 - 199 mg/dL   Triglycerides 096120 0 - 149 mg/dL   HDL 44 >04>39 mg/dL   VLDL Cholesterol Cal 24 5 - 40 mg/dL   LDL Calculated 98 0 - 99 mg/dL      Assessment & Plan:   Problem List Items Addressed This Visit      Other   Chronic pain (Chronic)    Stopped her cymbalta. Will restart it. Recheck 1 month. Call with any concerns.       Relevant Medications   amitriptyline (ELAVIL) 75 MG tablet   DULoxetine (CYMBALTA) 60 MG capsule   gabapentin (NEURONTIN) 100 MG capsule   Fibromyalgia    Stopped her cymbalta. Will restart it. Recheck 1 month. Call with any concerns.       Relevant Medications   amitriptyline (ELAVIL) 75 MG tablet   DULoxetine (CYMBALTA) 60 MG capsule   gabapentin (NEURONTIN) 100 MG capsule    Other Visit Diagnoses    Hot flashes    -  Primary   Has stopped her medicine. Restart medicine and recheck 1 month. Call with any concerns.    Relevant Medications   atorvastatin (LIPITOR) 10 MG tablet   Rash       Will treat itch with triamcinalone and ulcers with bactroban. Call with any concerns.        Follow up plan: Return As scheduled, for physical.   . This visit was completed via [employer] due to the restrictions of the COVID-19 pandemic. All issues as above were discussed  and addressed. Physical exam was done as above through visual confirmation on Doximity. If it was felt that the patient should be evaluated in the office, they were directed there. The patient verbally consented to this visit. . Location of the patient: home . Location of the provider: home . Those involved with this call:  . Provider: Olevia PerchesMegan Sherrine Salberg, DO . CMA: Tiffany Reel, CMA .  Front Desk/Registration: Don Perking  . Time spent on call: 25 minutes with patient face to face via video conference. More than 50% of this time was spent in counseling and coordination of care. 40 minutes total spent in review of patient's record and preparation of their chart.

## 2019-03-09 NOTE — Assessment & Plan Note (Signed)
Stopped her cymbalta. Will restart it. Recheck 1 month. Call with any concerns.  

## 2019-03-09 NOTE — Assessment & Plan Note (Signed)
Stopped her cymbalta. Will restart it. Recheck 1 month. Call with any concerns.

## 2019-04-14 ENCOUNTER — Encounter: Payer: BLUE CROSS/BLUE SHIELD | Admitting: Family Medicine

## 2019-04-24 ENCOUNTER — Other Ambulatory Visit: Payer: Self-pay

## 2019-04-24 ENCOUNTER — Ambulatory Visit (INDEPENDENT_AMBULATORY_CARE_PROVIDER_SITE_OTHER): Payer: BLUE CROSS/BLUE SHIELD | Admitting: Family Medicine

## 2019-04-24 ENCOUNTER — Encounter: Payer: Self-pay | Admitting: Family Medicine

## 2019-04-24 VITALS — BP 132/80 | HR 62 | Temp 97.9°F | Ht 61.42 in | Wt 162.0 lb

## 2019-04-24 DIAGNOSIS — F411 Generalized anxiety disorder: Secondary | ICD-10-CM | POA: Diagnosis not present

## 2019-04-24 DIAGNOSIS — Z114 Encounter for screening for human immunodeficiency virus [HIV]: Secondary | ICD-10-CM | POA: Diagnosis not present

## 2019-04-24 DIAGNOSIS — N951 Menopausal and female climacteric states: Secondary | ICD-10-CM

## 2019-04-24 DIAGNOSIS — Z0000 Encounter for general adult medical examination without abnormal findings: Secondary | ICD-10-CM | POA: Diagnosis not present

## 2019-04-24 DIAGNOSIS — G43109 Migraine with aura, not intractable, without status migrainosus: Secondary | ICD-10-CM | POA: Diagnosis not present

## 2019-04-24 DIAGNOSIS — G894 Chronic pain syndrome: Secondary | ICD-10-CM

## 2019-04-24 DIAGNOSIS — E7800 Pure hypercholesterolemia, unspecified: Secondary | ICD-10-CM

## 2019-04-24 MED ORDER — GABAPENTIN 300 MG PO CAPS: 300.0000 mg | ORAL_CAPSULE | Freq: Three times a day (TID) | ORAL | 1 refills | Status: AC

## 2019-04-24 MED ORDER — OXYBUTYNIN CHLORIDE ER 15 MG PO TB24: 15.0000 mg | ORAL_TABLET | Freq: Every day | ORAL | 1 refills | Status: AC

## 2019-04-24 MED ORDER — SUMATRIPTAN SUCCINATE 100 MG PO TABS: 100.0000 mg | ORAL_TABLET | ORAL | 12 refills | Status: AC | PRN

## 2019-04-24 MED ORDER — BUSPIRONE HCL 10 MG PO TABS: 10.0000 mg | ORAL_TABLET | Freq: Two times a day (BID) | ORAL | 1 refills | Status: AC

## 2019-04-24 MED ORDER — MEDROXYPROGESTERONE ACETATE 2.5 MG PO TABS: 2.5000 mg | ORAL_TABLET | Freq: Every day | ORAL | 3 refills | Status: AC

## 2019-04-24 MED ORDER — ATORVASTATIN CALCIUM 10 MG PO TABS: 10.0000 mg | ORAL_TABLET | Freq: Every day | ORAL | 1 refills | Status: AC

## 2019-04-24 MED ORDER — DULOXETINE HCL 60 MG PO CPEP: 60.0000 mg | ORAL_CAPSULE | Freq: Every day | ORAL | 1 refills | Status: AC

## 2019-04-24 NOTE — Assessment & Plan Note (Signed)
Follows with pain management. To be moving to [place]- advised her to establish ASAP with new provider up there and to make her pain management doctor down here aware that she is moving. Call with any concerns. Continue to monitor.

## 2019-04-24 NOTE — Patient Instructions (Addendum)
Call for your mammogram:  [facility]  Address: [address], [place], [place] [address]  Phone: [phone]   Health Maintenance for Postmenopausal Women Menopause is a normal process in which your ability to get pregnant comes to an end. This process happens slowly over many months or years, usually between the ages of 8848 and 7555. Menopause is complete when you have missed your menstrual periods for 12 months. It is important to talk with your health care provider about some of the most common conditions that affect women after menopause (postmenopausal women). These include heart disease, cancer, and bone loss (osteoporosis). Adopting a healthy lifestyle and getting preventive care can help to promote your health and wellness. The actions you take can also lower your chances of developing some of these common conditions. What should I know about menopause? During menopause, you may get a number of symptoms, such as:  Hot flashes. These can be moderate or severe.  Night sweats.  Decrease in sex drive.  Mood swings.  Headaches.  Tiredness.  Irritability.  Memory problems.  Insomnia. Choosing to treat or not to treat these symptoms is a decision that you make with your health care provider. Do I need hormone replacement therapy?  Hormone replacement therapy is effective in treating symptoms that are caused by menopause, such as hot flashes and night sweats.  Hormone replacement carries certain risks, especially as you become older. If you are thinking about using estrogen or estrogen with progestin, discuss the benefits and risks with your health care provider. What is my risk for heart disease and stroke? The risk of heart disease, heart attack, and stroke increases as you age. One of the causes may be a change in the body's hormones during menopause. This can affect how your body uses dietary fats, triglycerides, and cholesterol. Heart  attack and stroke are medical emergencies. There are many things that you can do to help prevent heart disease and stroke. Watch your blood pressure  High blood pressure causes heart disease and increases the risk of stroke. This is more likely to develop in people who have high blood pressure readings, are of African descent, or are overweight.  Have your blood pressure checked: ? Every 3-5 years if you are 4518-62 years of age. ? Every year if you are 62 years old or older. Eat a healthy diet   Eat a diet that includes plenty of vegetables, fruits, low-fat dairy products, and lean protein.  Do not eat a lot of foods that are high in solid fats, added sugars, or sodium. Get regular exercise Get regular exercise. This is one of the most important things you can do for your health. Most adults should:  Try to exercise for at least 150 minutes each week. The exercise should increase your heart rate and make you sweat (moderate-intensity exercise).  Try to do strengthening exercises at least twice each week. Do these in addition to the moderate-intensity exercise.  Spend less time sitting. Even light physical activity can be beneficial. Other tips  Work with your health care provider to achieve or maintain a healthy weight.  Do not use any products that contain nicotine or tobacco, such as cigarettes, e-cigarettes, and chewing tobacco. If you need help quitting, ask your health care provider.  Know your numbers. Ask your health care provider to check your cholesterol and your blood sugar (glucose). Continue to have your blood tested as directed by your health care provider. Do  I need screening for cancer? Depending on your health history and family history, you may need to have cancer screening at different stages of your life. This may include screening for:  Breast cancer.  Cervical cancer.  Lung cancer.  Colorectal cancer. What is my risk for osteoporosis? After menopause, you  may be at increased risk for osteoporosis. Osteoporosis is a condition in which bone destruction happens more quickly than new bone creation. To help prevent osteoporosis or the bone fractures that can happen because of osteoporosis, you may take the following actions:  If you are 62-51 years old, get at least 1,000 mg of calcium and at least 600 mg of vitamin D per day.  If you are older than age 47 but younger than age 93, get at least 1,200 mg of calcium and at least 600 mg of vitamin D per day.  If you are older than age 67, get at least 1,200 mg of calcium and at least 800 mg of vitamin D per day. Smoking and drinking excessive alcohol increase the risk of osteoporosis. Eat foods that are rich in calcium and vitamin D, and do weight-bearing exercises several times each week as directed by your health care provider. How does menopause affect my mental health? Depression may occur at any age, but it is more common as you become older. Common symptoms of depression include:  Low or sad mood.  Changes in sleep patterns.  Changes in appetite or eating patterns.  Feeling an overall lack of motivation or enjoyment of activities that you previously enjoyed.  Frequent crying spells. Talk with your health care provider if you think that you are experiencing depression. General instructions See your health care provider for regular wellness exams and vaccines. This may include:  Scheduling regular health, dental, and eye exams.  Getting and maintaining your vaccines. These include: ? Influenza vaccine. Get this vaccine each year before the flu season begins. ? Pneumonia vaccine. ? Shingles vaccine. ? Tetanus, diphtheria, and pertussis (Tdap) booster vaccine. Your health care provider may also recommend other immunizations. Tell your health care provider if you have ever been abused or do not feel safe at home. Summary  Menopause is a normal process in which your ability to get pregnant  comes to an end.  This condition causes hot flashes, night sweats, decreased interest in sex, mood swings, headaches, or lack of sleep.  Treatment for this condition may include hormone replacement therapy.  Take actions to keep yourself healthy, including exercising regularly, eating a healthy diet, watching your weight, and checking your blood pressure and blood sugar levels.  Get screened for cancer and depression. Make sure that you are up to date with all your vaccines. This information is not intended to replace advice given to you by your health care provider. Make sure you discuss any questions you have with your health care provider. Document Released: 11/27/2005 Document Revised: 09/28/2018 Document Reviewed: 09/28/2018 Elsevier Patient Education  2020 [employer].

## 2019-04-24 NOTE — Assessment & Plan Note (Addendum)
Under good control on current regimen. Continue current regimen. Continue to monitor. Call with any concerns. Refills given.   

## 2019-04-24 NOTE — Assessment & Plan Note (Signed)
Under good control on current regimen. Continue current regimen. Continue to monitor. Call with any concerns. Refills given.   

## 2019-04-24 NOTE — Assessment & Plan Note (Signed)
Under good control on current regimen. Continue current regimen. Continue to monitor. Call with any concerns. Refills given. Labs drawn today.   

## 2019-04-24 NOTE — Progress Notes (Signed)
BP 132/80   Pulse 62   Temp 97.9 F (36.6 C) (Oral)   Ht 5' 1.42" (1.56 m)   Wt 162 lb (73.5 kg)   SpO2 98%   BMI 30.20 kg/m    Subjective:    Patient ID: Jade Smith, female    DOB: [birth year], 63 y.o.   MRN: [id number]  HPI: Jade Smith is a 63 y.o. female presenting on 04/24/2019 for comprehensive medical examination. Current medical complaints include:  To be moving back to [place] next month.   HYPERLIPIDEMIA Hyperlipidemia status: excellent compliance Satisfied with current treatment?  no Side effects:  no Medication compliance: excellent compliance Past cholesterol meds: atorvastatin Supplements: none Aspirin:  no The 10-year ASCVD risk score Mikey Bussing DC Jr., et al., 2013) is: 4.3%   Values used to calculate the score:     Age: 12 years     Sex: Female     Is Non-Hispanic African American: No     Diabetic: No     Tobacco smoker: No     Systolic Blood Pressure: 675 mmHg     Is BP treated: No     HDL Cholesterol: 44 mg/dL     Total Cholesterol: 166 mg/dL Chest pain:  no Coronary artery disease:  yes  ANXIETY/STRESS Duration:controlled Anxious mood: no  Excessive worrying: no Irritability: no  Sweating: no Nausea: no Palpitations:no Hyperventilation: no Panic attacks: no Agoraphobia: no  Obscessions/compulsions: no Depressed mood: no Depression screen [facility] 2/9 04/24/2019 09/07/2018 01/13/2018 05/24/2017 12/16/2016  Decreased Interest 0 1 1 0 0  Down, Depressed, Hopeless 0 1 0 0 0  PHQ - 2 Score 0 2 1 0 0  Altered sleeping 3 3 2  - -  Tired, decreased energy 1 0 2 - -  Change in appetite 0 0 0 - -  Feeling bad or failure about yourself  0 0 0 - -  Trouble concentrating 0 0 0 - -  Moving slowly or fidgety/restless 0 1 0 - -  Suicidal thoughts 0 0 0 - -  PHQ-9 Score 4 6 5  - -  Difficult doing work/chores Not difficult at all Somewhat difficult Somewhat difficult - -   GAD 7 : Generalized Anxiety Score 04/24/2019 09/07/2018 11/26/2016  Nervous,  Anxious, on Edge 0 0 3  Control/stop worrying 1 3 3   Worry too much - different things 0 0 3  Trouble relaxing 0 0 2  Restless 0 0 2  Easily annoyed or irritable 0 0 1  Afraid - awful might happen 0 3 3  Total GAD 7 Score 1 6 17   Anxiety Difficulty Not difficult at all Not difficult at all Very difficult   Anhedonia: no Weight changes: no Insomnia: yes   Hypersomnia: no Fatigue/loss of energy: no Feelings of worthlessness: no Feelings of guilt: no Impaired concentration/indecisiveness: no Suicidal ideations: no  Crying spells: no Recent Stressors/Life Changes: yes   Relationship problems: no   Family stress: no     Financial stress: no    Job stress: no    Recent death/loss: no  She currently lives with: husband Menopausal Symptoms: no  Depression Screen done today and results listed below:  Depression screen [facility] 2/9 04/24/2019 09/07/2018 01/13/2018 05/24/2017 12/16/2016  Decreased Interest 0 1 1 0 0  Down, Depressed, Hopeless 0 1 0 0 0  PHQ - 2 Score 0 2 1 0 0  Altered sleeping 3 3 2  - -  Tired, decreased energy 1 0 2 - -  Change in  appetite 0 0 0 - -  Feeling bad or failure about yourself  0 0 0 - -  Trouble concentrating 0 0 0 - -  Moving slowly or fidgety/restless 0 1 0 - -  Suicidal thoughts 0 0 0 - -  PHQ-9 Score 4 6 5  - -  Difficult doing work/chores Not difficult at all Somewhat difficult Somewhat difficult - -    Past Medical History:  Past Medical History:  Diagnosis Date  . Anxiety   . Chronic hip pain (Right) 01/07/2016  . Chronic low back pain (Location of Primary Source of Pain) (Bilateral) (R>L) 01/07/2016  . Chronic lower extremity pain (Location of Secondary source of pain) (Bilateral) (R>L) 01/07/2016   The pain going down both lower extremities appears to be referred from the lower back.   . Chronic sacroiliac joint pain (Right) 01/07/2016  . Fibromyalgia   . Hyperlipidemia   . Migraine   . OAB (overactive bladder)   . OSA (obstructive sleep apnea)    . Sleep apnea     Surgical History:  Past Surgical History:  Procedure Laterality Date  . BREAST BIOPSY Left 02/10/2016   path pending  . BREAST EXCISIONAL BIOPSY Right    surgical bx age 63  . BREAST EXCISIONAL BIOPSY Right    surgical bx  . CARPAL TUNNEL RELEASE Bilateral   . CESAREAN SECTION    . TUBAL LIGATION      Medications:  Current Outpatient Medications on File Prior to Visit  Medication Sig  . amitriptyline (ELAVIL) 75 MG tablet Take 75 mg by mouth at bedtime.  Marland Kitchen. estradiol (ESTRACE) 2 MG tablet Take 1 tablet (2 mg total) by mouth daily.  . fentaNYL (DURAGESIC - DOSED MCG/HR) 50 MCG/HR Place 50 mcg onto the skin.  . hydrOXYzine (ATARAX/VISTARIL) 50 MG tablet Take 50 mg by mouth at bedtime.  . traMADol (ULTRAM) 50 MG tablet Take 50 mg by mouth as needed.   . triamcinolone ointment (KENALOG) 0.5 % Apply 1 application topically 2 (two) times daily. To stop itch   No current facility-administered medications on file prior to visit.     Allergies:  No Known Allergies  Social History:  Social History   Socioeconomic History  . Marital status: Married    Spouse name: Not on file  . Number of children: Not on file  . Years of education: Not on file  . Highest education level: Not on file  Occupational History  . Not on file  Social Needs  . Financial resource strain: Not on file  . Food insecurity    Worry: Not on file    Inability: Not on file  . Transportation needs    Medical: Not on file    Non-medical: Not on file  Tobacco Use  . Smoking status: Former Games developermoker  . Smokeless tobacco: Never Used  Substance and Sexual Activity  . Alcohol use: No  . Drug use: No  . Sexual activity: Yes  Lifestyle  . Physical activity    Days per week: Not on file    Minutes per session: Not on file  . Stress: Not on file  Relationships  . Social Musicianconnections    Talks on phone: Not on file    Gets together: Not on file    Attends religious service: Not on file     Active member of club or organization: Not on file    Attends meetings of clubs or organizations: Not on file    Relationship  status: Not on file  . Intimate partner violence    Fear of current or ex partner: Not on file    Emotionally abused: Not on file    Physically abused: Not on file    Forced sexual activity: Not on file  Other Topics Concern  . Not on file  Social History Narrative  . Not on file   Social History   Tobacco Use  Smoking Status Former Smoker  Smokeless Tobacco Never Used   Social History   Substance and Sexual Activity  Alcohol Use No    Family History:  Family History  Problem Relation Age of Onset  . Cancer Mother        mother had hyst to remove cancer not sure what type of cancer   . Diabetes Father   . Cancer Brother        colon  . CAD Brother     Past medical history, surgical history, medications, allergies, family history and social history reviewed with patient today and changes made to appropriate areas of the chart.   Review of Systems  Constitutional: Negative.   HENT: Negative.   Eyes: Negative.   Respiratory: Negative.   Cardiovascular: Negative.   Gastrointestinal: Negative.   Genitourinary: Negative.   Musculoskeletal: Negative.   Skin: Negative.   Neurological: Negative.   Endo/Heme/Allergies: Negative.   Psychiatric/Behavioral: Negative for depression, hallucinations, memory loss, substance abuse and suicidal ideas. The patient has insomnia. The patient is not nervous/anxious.     All other ROS negative except what is listed above and in the HPI.      Objective:    BP 132/80   Pulse 62   Temp 97.9 F (36.6 C) (Oral)   Ht 5' 1.42" (1.56 m)   Wt 162 lb (73.5 kg)   SpO2 98%   BMI 30.20 kg/m   Wt Readings from Last 3 Encounters:  04/24/19 162 lb (73.5 kg)  09/07/18 168 lb 9 oz (76.5 kg)  05/04/18 167 lb 3 oz (75.8 kg)    Physical Exam Vitals signs and nursing note reviewed.  Constitutional:      General:  She is not in acute distress.    Appearance: Normal appearance. She is not ill-appearing, toxic-appearing or diaphoretic.  HENT:     Head: Normocephalic and atraumatic.     Right Ear: Tympanic membrane, ear canal and external ear normal. There is no impacted cerumen.     Left Ear: Tympanic membrane, ear canal and external ear normal. There is no impacted cerumen.     Nose: Nose normal. No congestion or rhinorrhea.     Mouth/Throat:     Mouth: Mucous membranes are moist.     Pharynx: Oropharynx is clear. No oropharyngeal exudate or posterior oropharyngeal erythema.  Eyes:     General: No scleral icterus.       Right eye: No discharge.        Left eye: No discharge.     Extraocular Movements: Extraocular movements intact.     Conjunctiva/sclera: Conjunctivae normal.     Pupils: Pupils are equal, round, and reactive to light.  Neck:     Musculoskeletal: Normal range of motion and neck supple. No neck rigidity or muscular tenderness.     Vascular: No carotid bruit.  Cardiovascular:     Rate and Rhythm: Normal rate and regular rhythm.     Pulses: Normal pulses.     Heart sounds: No murmur. No friction rub. No gallop.   Pulmonary:  Effort: Pulmonary effort is normal. No respiratory distress.     Breath sounds: Normal breath sounds. No stridor. No wheezing, rhonchi or rales.  Chest:     Chest wall: No tenderness.  Abdominal:     General: Abdomen is flat. Bowel sounds are normal. There is no distension.     Palpations: Abdomen is soft. There is no mass.     Tenderness: There is no abdominal tenderness. There is no right CVA tenderness, left CVA tenderness, guarding or rebound.     Hernia: No hernia is present.  Genitourinary:    Comments: Breast and pelvic exams deferred with shared decision making Musculoskeletal:        General: No swelling, tenderness, deformity or signs of injury.     Right lower leg: No edema.     Left lower leg: No edema.  Lymphadenopathy:     Cervical: No  cervical adenopathy.  Skin:    General: Skin is warm and dry.     Capillary Refill: Capillary refill takes less than 2 seconds.     Coloration: Skin is not jaundiced or pale.     Findings: No bruising, erythema, lesion or rash.  Neurological:     General: No focal deficit present.     Mental Status: She is alert and oriented to person, place, and time. Mental status is at baseline.     Cranial Nerves: No cranial nerve deficit.     Sensory: No sensory deficit.     Motor: No weakness.     Coordination: Coordination normal.     Gait: Gait normal.     Deep Tendon Reflexes: Reflexes normal.  Psychiatric:        Mood and Affect: Mood normal.        Behavior: Behavior normal.        Thought Content: Thought content normal.        Judgment: Judgment normal.     Results for orders placed or performed in visit on 09/07/18  Comprehensive metabolic panel  Result Value Ref Range   Glucose 96 65 - 99 mg/dL   BUN 21 8 - 27 mg/dL   Creatinine, Ser 1.61 0.57 - 1.00 mg/dL   GFR calc non Af Amer 95 >59 mL/min/1.73   GFR calc Af Amer 109 >59 mL/min/1.73   BUN/Creatinine Ratio 31 (H) 12 - 28   Sodium 138 134 - 144 mmol/L   Potassium 3.9 3.5 - 5.2 mmol/L   Chloride 100 96 - 106 mmol/L   CO2 22 20 - 29 mmol/L   Calcium 9.5 8.7 - 10.3 mg/dL   Total Protein 7.0 6.0 - 8.5 g/dL   Albumin 4.7 3.6 - 4.8 g/dL   Globulin, Total 2.3 1.5 - 4.5 g/dL   Albumin/Globulin Ratio 2.0 1.2 - 2.2   Bilirubin Total 1.7 (H) 0.0 - 1.2 mg/dL   Alkaline Phosphatase 79 39 - 117 IU/L   AST 22 0 - 40 IU/L   ALT 20 0 - 32 IU/L  Lipid Panel w/o Chol/HDL Ratio  Result Value Ref Range   Cholesterol, Total 166 100 - 199 mg/dL   Triglycerides 096 0 - 149 mg/dL   HDL 44 >04 mg/dL   VLDL Cholesterol Cal 24 5 - 40 mg/dL   LDL Calculated 98 0 - 99 mg/dL      Assessment & Plan:   Problem List Items Addressed This Visit      Cardiovascular and Mediastinum   Acute onset aura migraine    Under  good control on current  regimen. Continue current regimen. Continue to monitor. Call with any concerns. Refills given.        Relevant Medications   atorvastatin (LIPITOR) 10 MG tablet   DULoxetine (CYMBALTA) 60 MG capsule   gabapentin (NEURONTIN) 300 MG capsule   SUMAtriptan (IMITREX) 100 MG tablet   Other Relevant Orders   TSH     Other   Chronic pain (Chronic)    Follows with pain management. To [place] moving to [place]- advised her to establish ASAP with new provider up there and to make her pain management doctor down here aware that she is moving. Call with any concerns. Continue to monitor.       Relevant Medications   DULoxetine (CYMBALTA) 60 MG capsule   gabapentin (NEURONTIN) 300 MG capsule   Anxiety, generalized    Under good control on current regimen. Continue current regimen. Continue to monitor. Call with any concerns. Refills given.        Relevant Medications   hydrOXYzine (ATARAX/VISTARIL) 50 MG tablet   busPIRone (BUSPAR) 10 MG tablet   DULoxetine (CYMBALTA) 60 MG capsule   Climacteric    Under good control on current regimen. Continue current regimen. Continue to monitor. Call with any concerns. Refills given.       Pure hypercholesterolemia    Under good control on current regimen. Continue current regimen. Continue to monitor. Call with any concerns. Refills given. Labs drawn today.      Relevant Medications   atorvastatin (LIPITOR) 10 MG tablet   Other Relevant Orders   Comprehensive metabolic panel   Lipid Panel w/o Chol/HDL Ratio    Other Visit Diagnoses    Routine general medical examination at a health care facility    -  Primary   Vaccines up to date. Screening labs checked today. Pap up to date. Mammogram ordered. Colonoscopy to be done when she returns to [place]. Continue diet and exercise.   Relevant Orders   CBC with Differential/Platelet   Comprehensive metabolic panel   Lipid Panel w/o Chol/HDL Ratio   TSH   UA/M w/rflx Culture, Routine   Encounter for screening for  HIV       Labs drawn today. Await results.    Relevant Orders   HIV Antibody (routine testing w rflx)       Follow up plan: Return If she moves back to the area.   LABORATORY TESTING:  - Pap smear: up to date  IMMUNIZATIONS:   - Tdap: Tetanus vaccination status reviewed: last tetanus booster within 10 years. - Influenza: Postponed to flu season - Pneumovax: Up to date - Prevnar: Not applicable - HPV: Not applicable - Zostavax vaccine: Up to date  SCREENING: -Mammogram: Order in- encouraged patient to go get it done  - Colonoscopy: Will get in [place]  - Bone Density: Not applicable   PATIENT COUNSELING:   Advised to take 1 mg of folate supplement per day if capable of pregnancy.   Sexuality: Discussed sexually transmitted diseases, partner selection, use of condoms, avoidance of unintended pregnancy  and contraceptive alternatives.   Advised to avoid cigarette smoking.  I discussed with the patient that most people either abstain from alcohol or drink within safe limits (<=14/week and <=4 drinks/occasion for males, <=7/weeks and <= 3 drinks/occasion for females) and that the risk for alcohol disorders and other health effects rises proportionally with the number of drinks per week and how often a drinker exceeds daily limits.  Discussed cessation/primary prevention of  drug use and availability of treatment for abuse.   Diet: Encouraged to adjust caloric intake to maintain  or achieve ideal body weight, to reduce intake of dietary saturated fat and total fat, to limit sodium intake by avoiding high sodium foods and not adding table salt, and to maintain adequate dietary potassium and calcium preferably from fresh fruits, vegetables, and low-fat dairy products.    stressed the importance of regular exercise  Injury prevention: Discussed safety belts, safety helmets, smoke detector, smoking near bedding or upholstery.   Dental health: Discussed importance of regular tooth  brushing, flossing, and dental visits.    NEXT PREVENTATIVE PHYSICAL DUE IN 1 YEAR. Return If she moves back to the area.

## 2019-04-25 ENCOUNTER — Encounter: Payer: Self-pay | Admitting: Family Medicine

## 2019-04-25 LAB — LIPID PANEL W/O CHOL/HDL RATIO
Cholesterol, Total: 130 mg/dL (ref 100–199)
HDL: 40 mg/dL (ref >39)
LDL Calculated: 76 mg/dL (ref 0–99)
Triglycerides: 69 mg/dL (ref 0–149)
VLDL Cholesterol Cal: 14 mg/dL (ref 5–40)

## 2019-04-25 LAB — CBC WITH DIFFERENTIAL/PLATELET
Basophils Absolute: 0.1 10*3/uL (ref 0.0–0.2)
Basos: 1 %
EOS (ABSOLUTE): 0.3 10*3/uL (ref 0.0–0.4)
Eos: 4 %
Hematocrit: 41.8 % (ref 34.0–46.6)
Hemoglobin: 14.2 g/dL (ref 11.1–15.9)
Immature Grans (Abs): 0 10*3/uL (ref 0.0–0.1)
Immature Granulocytes: 0 %
Lymphocytes Absolute: 2.4 10*3/uL (ref 0.7–3.1)
Lymphs: 35 %
MCH: 31.3 pg (ref 26.6–33.0)
MCHC: 34 g/dL (ref 31.5–35.7)
MCV: 92 fL (ref 79–97)
Monocytes Absolute: 0.6 10*3/uL (ref 0.1–0.9)
Monocytes: 9 %
Neutrophils Absolute: 3.6 10*3/uL (ref 1.4–7.0)
Neutrophils: 51 %
Platelets: 220 10*3/uL (ref 150–450)
RBC: 4.54 x10E6/uL (ref 3.77–5.28)
RDW: 12.7 % (ref 11.7–15.4)
WBC: 6.8 10*3/uL (ref 3.4–10.8)

## 2019-04-25 LAB — COMPREHENSIVE METABOLIC PANEL
ALT: 20 IU/L (ref 0–32)
AST: 19 IU/L (ref 0–40)
Albumin/Globulin Ratio: 2.2 (ref 1.2–2.2)
Albumin: 4.2 g/dL (ref 3.8–4.8)
Alkaline Phosphatase: 65 IU/L (ref 39–117)
BUN/Creatinine Ratio: 29 — ABNORMAL HIGH (ref 12–28)
BUN: 20 mg/dL (ref 8–27)
Bilirubin Total: 1.5 mg/dL — ABNORMAL HIGH (ref 0.0–1.2)
CO2: 26 mmol/L (ref 20–29)
Calcium: 9.1 mg/dL (ref 8.7–10.3)
Chloride: 103 mmol/L (ref 96–106)
Creatinine, Ser: 0.7 mg/dL (ref 0.57–1.00)
GFR calc Af Amer: 107 mL/min/{1.73_m2} (ref >59)
GFR calc non Af Amer: 93 mL/min/{1.73_m2} (ref >59)
Globulin, Total: 1.9 g/dL (ref 1.5–4.5)
Glucose: 86 mg/dL (ref 65–99)
Potassium: 3.4 mmol/L — ABNORMAL LOW (ref 3.5–5.2)
Sodium: 142 mmol/L (ref 134–144)
Total Protein: 6.1 g/dL (ref 6.0–8.5)

## 2019-04-25 LAB — TSH: TSH: 1.51 u[IU]/mL (ref 0.450–4.500)

## 2019-04-25 LAB — HIV ANTIBODY (ROUTINE TESTING W REFLEX): HIV Screen 4th Generation wRfx: NONREACTIVE
